# Patient Record
Sex: Female | Born: 1965 | Race: White | Hispanic: No | Marital: Married | Smoking: Never smoker
Health system: Southern US, Community
[De-identification: ages and names within clinical notes are randomized; demographics above are authoritative.]

## PROBLEM LIST (undated history)

## (undated) DIAGNOSIS — F419 Anxiety disorder, unspecified: Secondary | ICD-10-CM

## (undated) DIAGNOSIS — F32A Depression, unspecified: Secondary | ICD-10-CM

## (undated) DIAGNOSIS — D509 Iron deficiency anemia, unspecified: Secondary | ICD-10-CM

## (undated) DIAGNOSIS — F329 Major depressive disorder, single episode, unspecified: Secondary | ICD-10-CM

## (undated) HISTORY — DX: Depression, unspecified: F32.A

## (undated) HISTORY — DX: Iron deficiency anemia, unspecified: D50.9

## (undated) HISTORY — DX: Anxiety disorder, unspecified: F41.9

## (undated) HISTORY — DX: Major depressive disorder, single episode, unspecified: F32.9

---

## 2005-10-25 ENCOUNTER — Ambulatory Visit (HOSPITAL_COMMUNITY): Admission: RE | Admit: 2005-10-25 | Discharge: 2005-10-25 | Payer: Self-pay | Admitting: Family Medicine

## 2007-08-03 ENCOUNTER — Ambulatory Visit (HOSPITAL_COMMUNITY): Admission: RE | Admit: 2007-08-03 | Discharge: 2007-08-03 | Payer: Self-pay | Admitting: Family Medicine

## 2010-07-06 ENCOUNTER — Ambulatory Visit (HOSPITAL_COMMUNITY): Admission: RE | Admit: 2010-07-06 | Discharge: 2010-07-06 | Payer: Self-pay | Admitting: Family Medicine

## 2012-03-17 IMAGING — CR DG LUMBAR SPINE COMPLETE 4+V
5 series · 5 of 5 positions shown · non-contrast
Comparison: Lumbar spine series 10/25/2005.

CLINICAL DATA: Low back and left leg pain.

LUMBAR SPINE - COMPLETE 4+ VIEW

[view not recorded (1 of 5)]
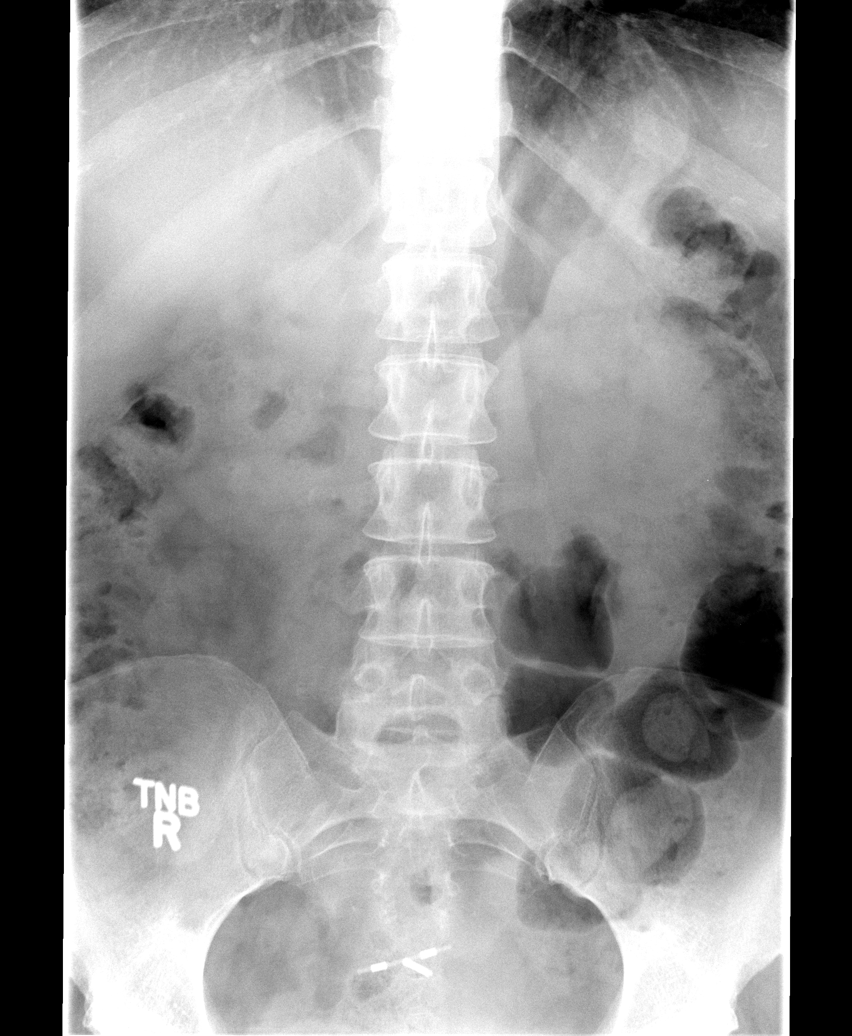

[view not recorded (2 of 5)]
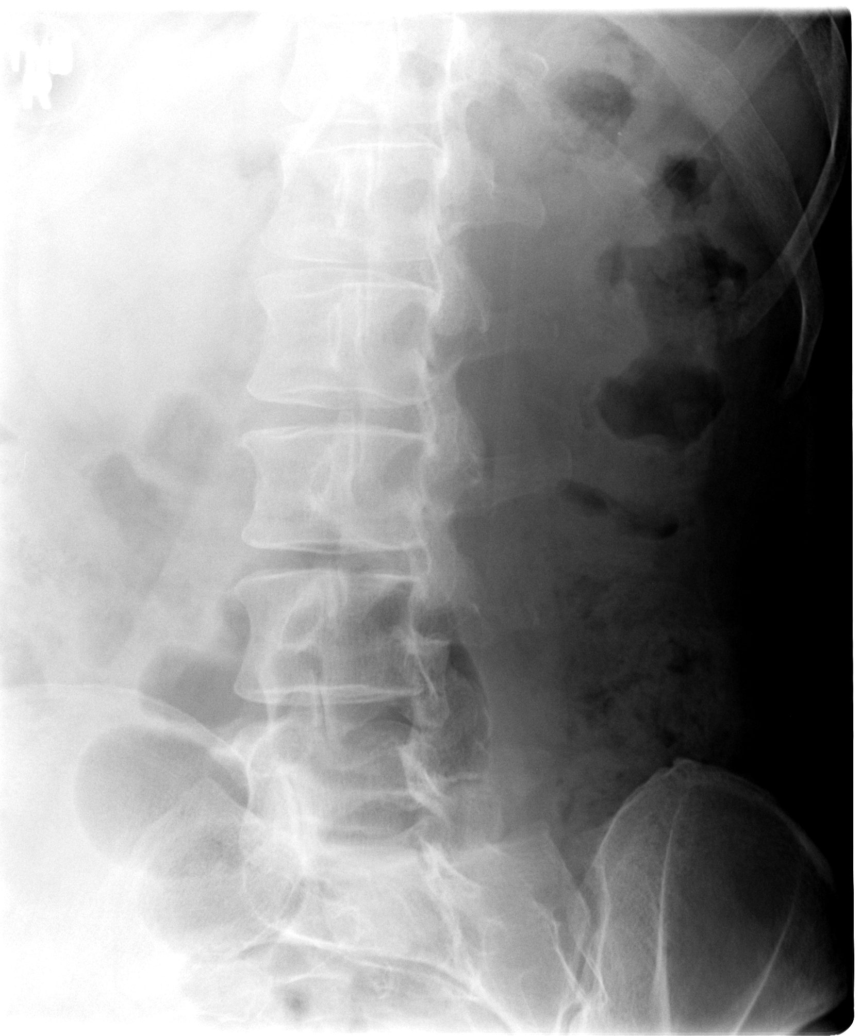

[view not recorded (3 of 5)]
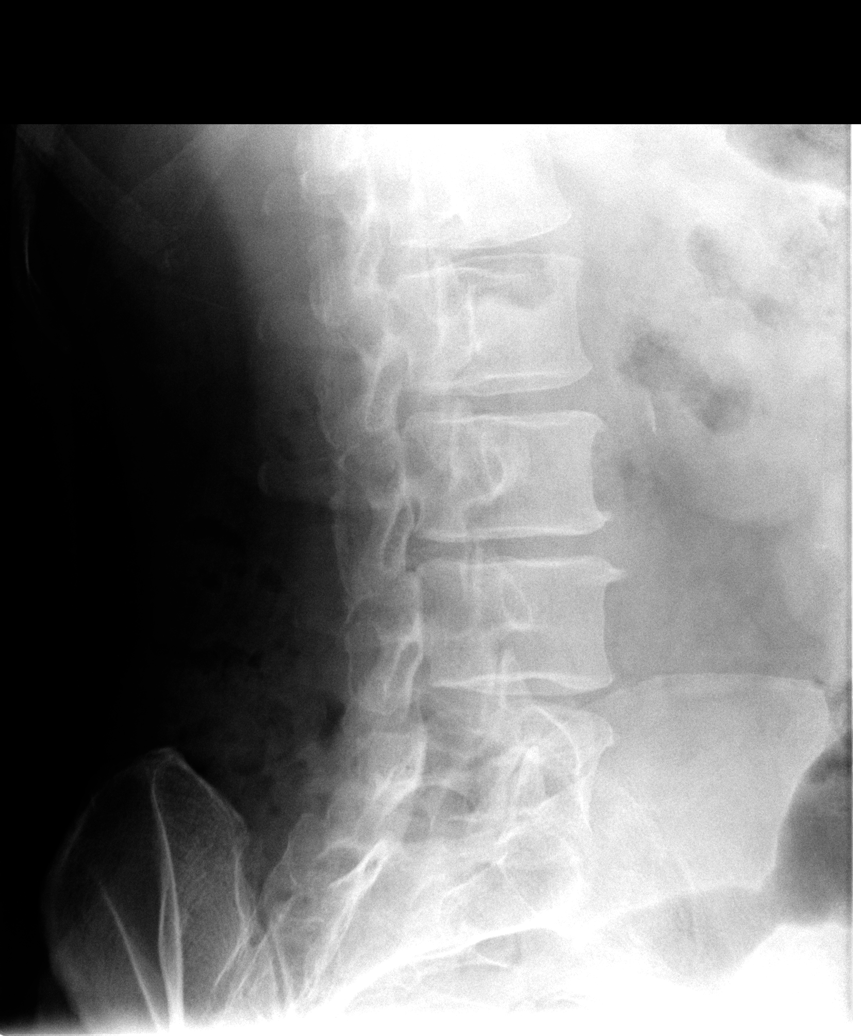

[view not recorded (4 of 5)]
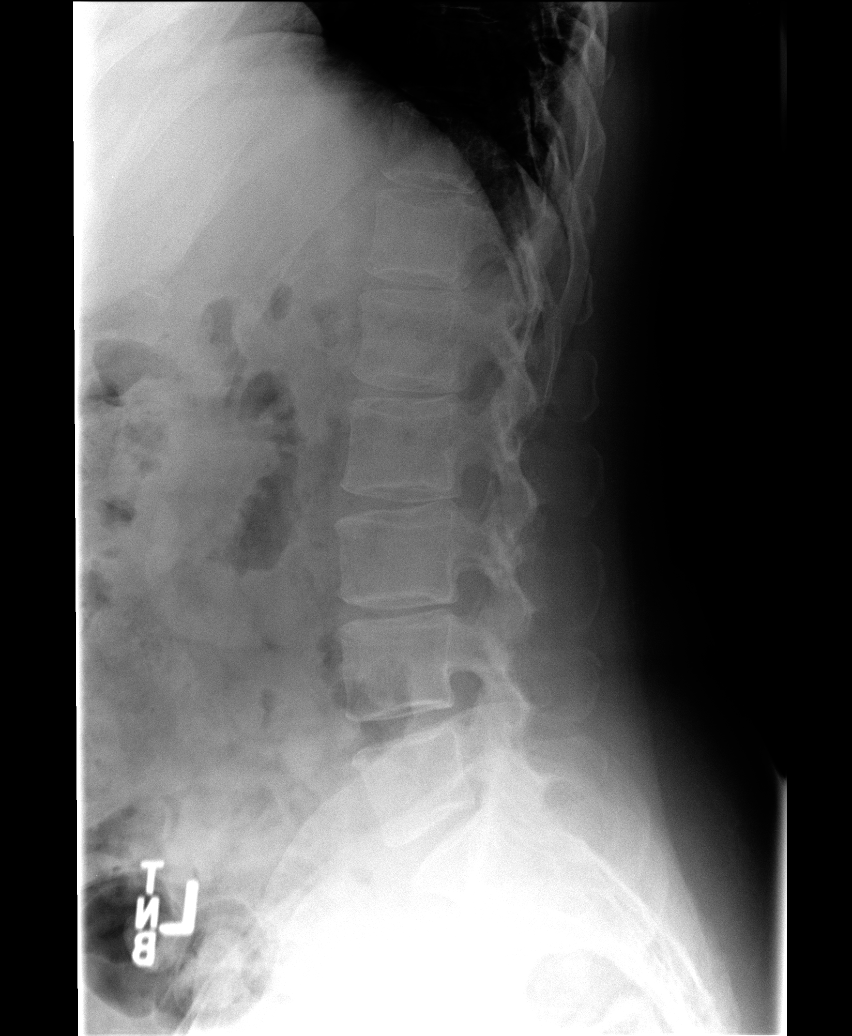

[view not recorded (5 of 5)]
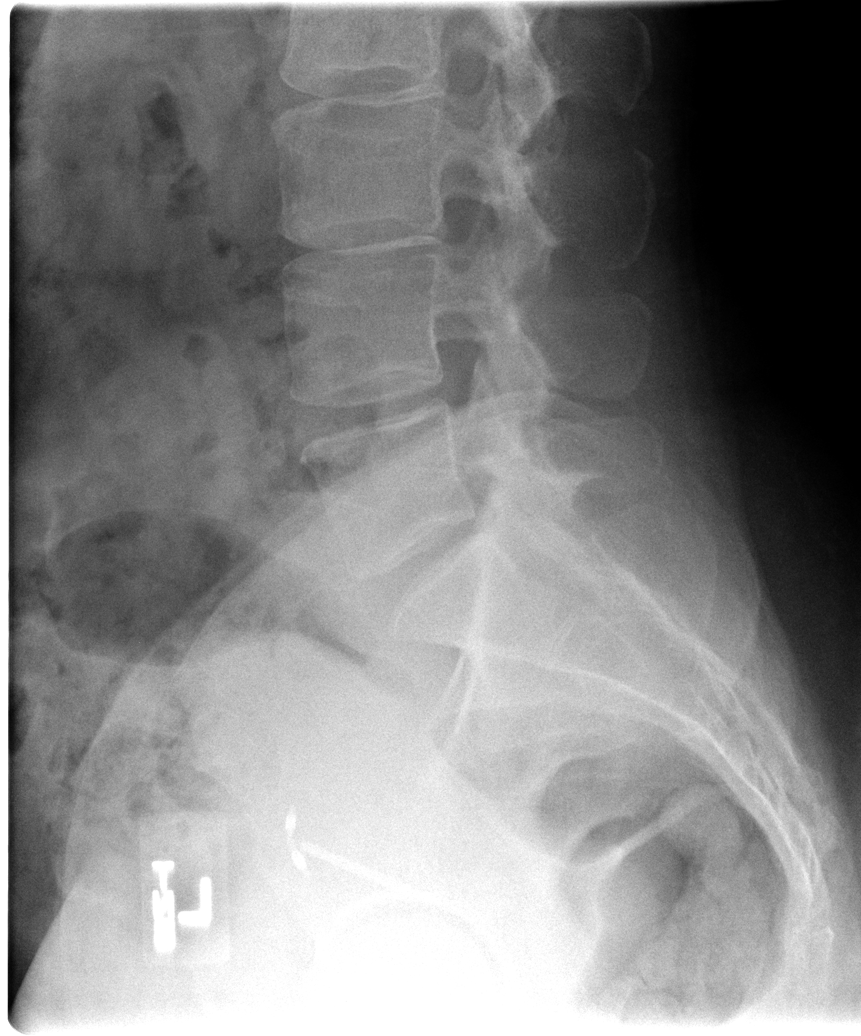

[5 of 5 positions shown; findings below may reference images not displayed]

FINDINGS: The lateral film demonstrates normal alignment.
Vertebral bodies and disc spaces are maintained.  No acute bony
findings.  Normal alignment of the facet joints and no pars
defects.  The visualized bony pelvis in intact.  An IUD is noted in
the pelvis.
IMPRESSION: Normal alignment and no acute bony findings.  No change since prior
study.

## 2012-09-17 ENCOUNTER — Ambulatory Visit (INDEPENDENT_AMBULATORY_CARE_PROVIDER_SITE_OTHER): Payer: PRIVATE HEALTH INSURANCE | Admitting: Psychiatry

## 2012-09-17 ENCOUNTER — Encounter (HOSPITAL_COMMUNITY): Payer: Self-pay | Admitting: Psychiatry

## 2012-09-17 VITALS — Ht 65.5 in | Wt 179.4 lb

## 2012-09-17 DIAGNOSIS — F419 Anxiety disorder, unspecified: Secondary | ICD-10-CM | POA: Insufficient documentation

## 2012-09-17 DIAGNOSIS — F3289 Other specified depressive episodes: Secondary | ICD-10-CM

## 2012-09-17 DIAGNOSIS — E559 Vitamin D deficiency, unspecified: Secondary | ICD-10-CM | POA: Insufficient documentation

## 2012-09-17 DIAGNOSIS — F329 Major depressive disorder, single episode, unspecified: Secondary | ICD-10-CM

## 2012-09-17 DIAGNOSIS — F410 Panic disorder [episodic paroxysmal anxiety] without agoraphobia: Secondary | ICD-10-CM

## 2012-09-17 DIAGNOSIS — F411 Generalized anxiety disorder: Secondary | ICD-10-CM

## 2012-09-17 MED ORDER — VENLAFAXINE HCL ER 75 MG PO CP24: 150.0000 mg | ORAL_CAPSULE | Freq: Every day | ORAL | Status: DC

## 2012-09-17 MED ORDER — PROPRANOLOL HCL 10 MG PO TABS: 10.0000 mg | ORAL_TABLET | Freq: Three times a day (TID) | ORAL | Status: DC

## 2012-09-17 NOTE — Progress Notes (Addendum)
Psychiatric Assessment Adult [facility] [id number] Progress Note Whitney Valentine MRN: [id number] DOB: Feb 12, 1966 Age: 47 y.o.  Date: 09/17/2012 Start Time: 1:30 PM End Time: 2:30 PM  Chief Complaint: Chief Complaint  Patient presents with  . Anxiety  . Follow-up  . Medication Refill    History of Chief Complaint:   See HPI:  Anxiety Presents for initial visit. Onset was more than 5 years ago. The problem has been waxing and waning. Symptoms include chest pain, confusion, decreased concentration, depressed mood, dizziness, excessive worry, feeling of choking, hyperventilation, irritability, malaise, muscle tension, nausea, nervous/anxious behavior, palpitations, panic and shortness of breath. Patient reports no compulsions, dry mouth, insomnia, obsessions, restlessness or suicidal ideas. Symptoms occur constantly. The severity of symptoms is causing significant distress. The symptoms are aggravated by caffeine, family issues, social activities and work stress. The patient sleeps 13 hours per night. The quality of sleep is non-restorative. Nighttime awakenings: occasional.   Risk factors include family history, a major life event and marital problems. Her past medical history is significant for anxiety/panic attacks, arrhythmia and depression. There is no history of anemia, asthma, bipolar disorder, CAD, CHF, chronic lung disease, fibromyalgia, hyperthyroidism or suicide attempts. Past treatments include non-SSRI antidepressants and herbal remedies. The treatment provided mild relief. Compliance with prior treatments has been good.   Review of Systems  Constitutional: Positive for irritability, fatigue and unexpected weight change. Negative for fever, chills, diaphoresis, activity change and appetite change.  HENT: Positive for sneezing. Negative for hearing loss, ear pain, nosebleeds, congestion, sore throat, facial swelling, rhinorrhea, drooling, mouth sores, trouble  swallowing, neck pain, neck stiffness, dental problem, voice change, postnasal drip, sinus pressure, tinnitus and ear discharge.   Eyes: Negative.   Respiratory: Positive for chest tightness and shortness of breath. Negative for apnea, cough, choking, wheezing and stridor.   Cardiovascular: Positive for chest pain and palpitations. Negative for leg swelling.  Gastrointestinal: Positive for nausea. Negative for vomiting, abdominal pain, diarrhea, constipation, blood in stool, abdominal distention, anal bleeding and rectal pain.  Genitourinary: Negative.   Musculoskeletal: Negative.   Skin: Negative.   Neurological: Positive for dizziness and light-headedness. Negative for tremors, seizures, syncope, facial asymmetry, speech difficulty, weakness, numbness and headaches.  Hematological: Negative.   Psychiatric/Behavioral: Positive for confusion, sleep disturbance, dysphoric mood, decreased concentration and agitation. Negative for suicidal ideas, hallucinations, behavioral problems and self-injury. The patient is nervous/anxious. The patient does not have insomnia and is not hyperactive.   Skin: gets acne break outs when nervous.  Physical Exam  Depressive Symptoms: anhedonia, hypersomnia, psychomotor agitation, fatigue, difficulty concentrating, hopelessness, impaired memory, anxiety, panic attacks, weight gain, decreased labido, increased appetite,  (Hypo) Manic Symptoms:   Elevated Mood:  No Irritable Mood:  Yes Grandiosity:  No Distractibility:  Yes Labiality of Mood:  Yes Delusions:  No Hallucinations:  No Impulsivity:  No Sexually Inappropriate Behavior:  No Financial Extravagance:  No Flight of Ideas:  No  Anxiety Symptoms: Excessive Worry:  Yes Panic Symptoms:  Yes Agoraphobia:  No Obsessive Compulsive: No  Symptoms:  Specific Phobias:  No Social Anxiety:  No  Psychotic Symptoms:  Hallucinations: No  Delusions:  No Paranoia:  No   Ideas of Reference:   No  PTSD Symptoms: Ever had a traumatic exposure:  No Had a traumatic exposure in the last month:  No Re-experiencing: No  Hypervigilance:  No Hyperarousal: No  Avoidance: No   Traumatic Brain Injury: No   Past Psychiatric History: Diagnosis: Anxiety and Depression  Hospitalizations:  none  Outpatient Care: PCP  Substance Abuse Care: none  Self-Mutilation: none  Suicidal Attempts: none  Violent Behaviors: none   Past Medical History:   Past Medical History  Diagnosis Date  . Anxiety    History of Loss of Consciousness:  No Seizure History:  No Cardiac History:  No Allergies:   Allergies  Allergen Reactions  . Penicillins Other (See Comments)    Joint swelling    Current Medications:  Current Outpatient Prescriptions  Medication Sig Dispense Refill  . venlafaxine (EFFEXOR) 37.5 MG tablet Take 37.5 mg by mouth 2 (two) times daily.        Previous Psychotropic Medications:  Medication Dose   Venlafaxin  37.5 mg BID                     Substance Abuse History in the last 12 months: Substance Age of 1st Use Last Use Amount Specific Type  Nicotine  15  20      Alcohol  teens  teens      Cannabis  none        Opiates  none        Cocaine  none        Methamphetamines  none        LSD  none        Ecstasy  none         Benzodiazepines  none        Caffeine  childhood  1 hours ago  20 oz  Diet Coke  Inhalants  none        Others:       Sugar  childhood  today  handful  chocolate covered nuts                Medical Consequences of Substance Abuse: weight gain  Legal Consequences of Substance Abuse: none  Family Consequences of Substance Abuse: none  Blackouts:  No DT's:  No Withdrawal Symptoms:  No   Social History: Current Place of Residence: *[address] [place] [address] Place of Birth: [place], [place] Family Members: husband and son Marital Status:  Married Children: 1  Sons: 1  Daughters: 0 Relationships: husband and son Education:   HS Print production planner Problems/Performance: concentration, focus, retention Religious Beliefs/Practices: Advice worker History of Abuse: none Armed forces technical officer; Pensions consultant History:  None. Legal History: none Hobbies/Interests: reading walking and music  Family History:   Family History  Problem Relation Age of Onset  . Anxiety disorder Mother   . Cancer Mother   . Depression Mother   . Alcohol abuse Father   . Anxiety disorder Father   . Cancer Father   . Depression Father   . Alcohol abuse Brother   . Anxiety disorder Brother   . Cancer Brother   . Dementia Maternal Grandmother   . ADD / ADHD Neg Hx   . Bipolar disorder Neg Hx   . Drug abuse Neg Hx   . OCD Neg Hx   . Paranoid behavior Neg Hx   . Schizophrenia Neg Hx   . Seizures Neg Hx   . Sexual abuse Neg Hx   . Physical abuse Neg Hx   . Anxiety disorder Son     Mental Status Examination/Evaluation: Objective:  Appearance: Casual  Eye Contact::  Good  Speech:  Clear and Coherent  Volume:  Normal  Mood:  Depressed  Affect:  Congruent  Thought Process:  Coherent, Intact and Logical  Orientation:  Full (Time, Place, and Person)  Thought Content:  WDL  Suicidal Thoughts:  No  Homicidal Thoughts:  No  Judgement:  Good  Insight:  Good  Psychomotor Activity:  Normal  Akathisia:  No  Handed:  Left  AIMS (if indicated):    Assets:  Communication Skills Desire for Improvement    Laboratory/X-Ray Psychological Evaluation(s)        Assessment:    AXIS I Depressive Disorder NOS, Generalized Anxiety Disorder and Panic Disorder  AXIS II Deferred  AXIS III Past Medical History  Diagnosis Date  . Anxiety      AXIS IV other psychosocial or environmental problems  AXIS V 51-60 moderate symptoms   Treatment Plan/Recommendations:  Laboratory:  Vitamin D  Psychotherapy: CBT  Medications: Effexor and Inderal  Routine PRN Medications:  No  Consultations: none  Safety  Concerns:  none  Other:     Plan/Discussion/Summary: I took her vitals.  I reviewed CC, tobacco/med/surg Hx, meds effects/ side effects, problem list, therapies and responses as well as current situation/symptoms discussed options. See orders and pt instructions for more details.  Orson Aloe, MD, [facility] Addendum: 09/18/2012 Vitamin level low at 26,  Scripts for replacement ordered.  Home number not available.  Called pharmacy and asked them to call and notify that scripts available for her. Orson Aloe, MD, [facility]

## 2012-09-17 NOTE — Patient Instructions (Addendum)
Get Vitamin D level  CUT BACK/CUT OUT on sugar and carbohydrates, that means very limited fruits and starchy vegetables and very limited grains, breads  The goal is low GLYCEMIC INDEX.  CUT OUT all wheat, rye, or barley for the GLUTEN in them.  HIGH fat and LOW carbohydrate diet is the KEY.  Eat avocados, eggs, lean meat like grass fed beef and chicken  Nuts and seeds would be good foods as well.   Stevia is an excellent sweetener.  Safe for the brain.   Erythritol is also acceptable for brain health.   Almond butter is awesome.  Check out all this on the Internet.  Relaxation is the ultimate solution for you.  You can seek it through tub baths, bubble baths, essential oils or incense, walking or chatting with friends, listening to soft music, watching a candle burn and just letting all thoughts go and appreciating the true essence of the Creator.   "I am Wishes Fulfilled Meditation" by Marylene Buerger and Lyndal Pulley may be helpful for meditation  Yoga is a very helpful exercise method.  On TV or on line [employer] is a source of high quality information about yoga and videos on yoga.  Renee Ramus is the world's number one video yoga instructor according to some experts.  There are exceptional health benefits that can be achieved through yoga.  The main principles of yoga is acceptance, no competition, no comparison, and no judgement.  It is exceptional in helping people meditate and get to a very relaxed state.   Strongly consider attending at least 6 Alanon Meetings to help you learn about how your helping others to the exclusion of helping yourself is actually hurting yourself and is actually an addiction to fixing others and that you need to work the 12 Step to Happiness through the [employer]. Al-Anon Family Groups could be helpful with how to deal with substance abusing family and friends. Or your own issues of being in victim role.  There are only 40 Alanon Family Group meetings  a week here in [place].  Online are current listing of those meetings @ [email or web address]  There are [employer].  Search on line and there you can learn the format and can access the schedule for yourself.  Their number is [phone]  The one in [place] is at the [facility] on [address]  The meeting is at 7 PM on Tuesdays.   Call if problems or concerns.

## 2012-09-18 MED ORDER — ERGOCALCIFEROL 1.25 MG (50000 UT) PO CAPS: 50000.0000 [IU] | ORAL_CAPSULE | ORAL | Status: DC

## 2012-09-18 MED ORDER — CALCIUM CARBONATE-VITAMIN D 500-200 MG-UNIT PO TABS: 1.0000 | ORAL_TABLET | Freq: Two times a day (BID) | ORAL | Status: DC

## 2012-09-18 NOTE — Addendum Note (Signed)
Addended by: Mike Craze on: 09/18/2012 01:21 PM   Modules accepted: Orders

## 2012-10-31 ENCOUNTER — Ambulatory Visit (HOSPITAL_COMMUNITY): Payer: Self-pay | Admitting: Psychiatry

## 2012-11-27 ENCOUNTER — Encounter (HOSPITAL_COMMUNITY): Payer: Self-pay | Admitting: Psychiatry

## 2012-11-27 ENCOUNTER — Ambulatory Visit (INDEPENDENT_AMBULATORY_CARE_PROVIDER_SITE_OTHER): Payer: PRIVATE HEALTH INSURANCE | Admitting: Psychiatry

## 2012-11-27 VITALS — Wt 178.4 lb

## 2012-11-27 DIAGNOSIS — R413 Other amnesia: Secondary | ICD-10-CM

## 2012-11-27 DIAGNOSIS — F411 Generalized anxiety disorder: Secondary | ICD-10-CM

## 2012-11-27 DIAGNOSIS — E559 Vitamin D deficiency, unspecified: Secondary | ICD-10-CM

## 2012-11-27 DIAGNOSIS — F410 Panic disorder [episodic paroxysmal anxiety] without agoraphobia: Secondary | ICD-10-CM

## 2012-11-27 DIAGNOSIS — F419 Anxiety disorder, unspecified: Secondary | ICD-10-CM

## 2012-11-27 DIAGNOSIS — F329 Major depressive disorder, single episode, unspecified: Secondary | ICD-10-CM

## 2012-11-27 MED ORDER — PROPRANOLOL HCL 10 MG PO TABS: 10.0000 mg | ORAL_TABLET | Freq: Four times a day (QID) | ORAL | Status: DC

## 2012-11-27 MED ORDER — DONEPEZIL HCL 5 MG PO TABS: 5.0000 mg | ORAL_TABLET | Freq: Every day | ORAL | Status: DC

## 2012-11-27 MED ORDER — VENLAFAXINE HCL ER 75 MG PO CP24: 150.0000 mg | ORAL_CAPSULE | Freq: Every day | ORAL | Status: DC

## 2012-11-27 NOTE — Progress Notes (Signed)
[facility] [id number] Progress Note Whitney Valentine MRN: [id number] DOB: 07-Jul-1966 Age: 47 y.o.  Date: 11/27/2012 Start Time: 3:20 PM End Time: 4:02 PM  Chief Complaint: Chief Complaint  Patient presents with  . Anxiety  . Depression  . Follow-up  . Medication Refill   Subjective: "I'm doing well, stable". Depression 7/10 and Anxiety 5/10, where 0 is none and 10 is the worst.  Pain is 0/10  Pt returns for follow up appointment.  Pt reports that she is compliant with the psychotropic medications with good benefit and no noticeable side effects.  She has noted particular benefit with the Inderal.  Her anxiety is still pretty distracting and her memory is very bad and causes difficulty with her job.  Will push Inderal and consider adding Aricept.  History of Chief Complaint:   See HPI:  Anxiety Presents for initial visit. Onset was more than 5 years ago. The problem has been waxing and waning. Symptoms include chest pain, confusion, decreased concentration, depressed mood, dizziness, excessive worry, feeling of choking, hyperventilation, irritability, malaise, muscle tension, nausea, nervous/anxious behavior, palpitations, panic and shortness of breath. Patient reports no compulsions, dry mouth, insomnia, obsessions, restlessness or suicidal ideas. Symptoms occur constantly. The severity of symptoms is causing significant distress. The symptoms are aggravated by caffeine, family issues, social activities and work stress. The patient sleeps 13 hours per night. The quality of sleep is non-restorative. Nighttime awakenings: occasional.   Risk factors include family history, a major life event and marital problems. Her past medical history is significant for anxiety/panic attacks, arrhythmia and depression. There is no history of anemia, asthma, bipolar disorder, CAD, CHF, chronic lung disease, fibromyalgia, hyperthyroidism or suicide attempts. Past treatments include non-SSRI  antidepressants and herbal remedies. The treatment provided mild relief. Compliance with prior treatments has been good.   Review of Systems  Constitutional: Positive for irritability, fatigue and unexpected weight change. Negative for fever, chills, diaphoresis, activity change and appetite change.  HENT: Positive for sneezing. Negative for hearing loss, ear pain, nosebleeds, congestion, sore throat, facial swelling, rhinorrhea, drooling, mouth sores, trouble swallowing, neck pain, neck stiffness, dental problem, voice change, postnasal drip, sinus pressure, tinnitus and ear discharge.   Eyes: Negative.   Respiratory: Positive for chest tightness and shortness of breath. Negative for apnea, cough, choking, wheezing and stridor.   Cardiovascular: Positive for chest pain and palpitations. Negative for leg swelling.  Gastrointestinal: Positive for nausea. Negative for vomiting, abdominal pain, diarrhea, constipation, blood in stool, abdominal distention, anal bleeding and rectal pain.  Genitourinary: Negative.   Musculoskeletal: Negative.   Skin: Negative.   Neurological: Positive for dizziness and light-headedness. Negative for tremors, seizures, syncope, facial asymmetry, speech difficulty, weakness, numbness and headaches.  Psychiatric/Behavioral: Positive for confusion, sleep disturbance, dysphoric mood, decreased concentration and agitation. Negative for suicidal ideas, hallucinations, behavioral problems and self-injury. The patient is nervous/anxious. The patient does not have insomnia and is not hyperactive.   Skin: gets acne break outs when nervous.  Physical Exam  Depressive Symptoms: anhedonia, hypersomnia, psychomotor agitation, fatigue, difficulty concentrating, hopelessness, impaired memory, anxiety, panic attacks, weight gain, decreased labido, increased appetite,  (Hypo) Manic Symptoms:   Elevated Mood:  No Irritable Mood:  Yes Grandiosity:  No Distractibility:   Yes Labiality of Mood:  Yes Delusions:  No Hallucinations:  No Impulsivity:  No Sexually Inappropriate Behavior:  No Financial Extravagance:  No Flight of Ideas:  No  Anxiety Symptoms: Excessive Worry:  Yes Panic Symptoms:  Yes Agoraphobia:  No Obsessive  Compulsive: No  Symptoms:  Specific Phobias:  No Social Anxiety:  No  Psychotic Symptoms:  Hallucinations: No  Delusions:  No Paranoia:  No   Ideas of Reference:  No  PTSD Symptoms: Ever had a traumatic exposure:  No Had a traumatic exposure in the last month:  No Re-experiencing: No  Hypervigilance:  No Hyperarousal: No  Avoidance: No   Traumatic Brain Injury: No   Past Psychiatric History: Diagnosis: Anxiety and Depression  Hospitalizations: none  Outpatient Care: PCP  Substance Abuse Care: none  Self-Mutilation: none  Suicidal Attempts: none  Violent Behaviors: none   Past Medical History:   Past Medical History  Diagnosis Date  . Anxiety   . Depression    History of Loss of Consciousness:  No Seizure History:  No Cardiac History:  No Allergies:   Allergies  Allergen Reactions  . Penicillins Other (See Comments)    Joint swelling    Current Medications:  Current Outpatient Prescriptions  Medication Sig Dispense Refill  . calcium-vitamin D (OSCAL 500/200 D-3) 500-200 MG-UNIT per tablet Take 1 tablet by mouth 2 (two) times daily.  60 tablet  1  . ergocalciferol (VITAMIN D2) 50000 UNITS capsule Take 1 capsule (50,000 Units total) by mouth once a week. Take until you run out of fills and then get Vitamin level retested.  4 capsule  1  . propranolol (INDERAL) 10 MG tablet Take 1 tablet (10 mg total) by mouth 3 (three) times daily.  90 tablet  1  . venlafaxine XR (EFFEXOR XR) 75 MG 24 hr capsule Take 2 capsules (150 mg total) by mouth daily.  60 capsule  2   No current facility-administered medications for this visit.    Previous Psychotropic Medications:  Medication Dose   Venlafaxin  37.5 mg BID                      Substance Abuse History in the last 12 months: Substance Age of 1st Use Last Use Amount Specific Type  Nicotine  15  20      Alcohol  teens  teens      Cannabis  none        Opiates  none        Cocaine  none        Methamphetamines  none        LSD  none        Ecstasy  none         Benzodiazepines  none        Caffeine  childhood  1 hours ago  20 oz  Diet Coke  Inhalants  none        Others:       Sugar  childhood  today  handful  chocolate covered nuts                Medical Consequences of Substance Abuse: weight gain  Legal Consequences of Substance Abuse: none  Family Consequences of Substance Abuse: none  Blackouts:  No DT's:  No Withdrawal Symptoms:  No   Social History: Current Place of Residence: *[address] [place] [address] Place of Birth: [place], [place] Family Members: husband and son Marital Status:  Married Children: 1  Sons: 1  Daughters: 0 Relationships: husband and son Education:  HS Print production planner Problems/Performance: concentration, focus, retention Religious Beliefs/Practices: Advice worker History of Abuse: none Armed forces technical officer; Pensions consultant History:  None. Legal History: none Hobbies/Interests: reading walking  and music  Family History:   Family History  Problem Relation Age of Onset  . Anxiety disorder Mother   . Cancer Mother   . Depression Mother   . Alcohol abuse Father   . Anxiety disorder Father   . Cancer Father   . Depression Father   . Alcohol abuse Brother   . Anxiety disorder Brother   . Cancer Brother   . Dementia Maternal Grandmother   . ADD / ADHD Neg Hx   . Bipolar disorder Neg Hx   . Drug abuse Neg Hx   . OCD Neg Hx   . Paranoid behavior Neg Hx   . Schizophrenia Neg Hx   . Seizures Neg Hx   . Sexual abuse Neg Hx   . Physical abuse Neg Hx   . Anxiety disorder Son   . Exostosis Son     Mental Status Examination/Evaluation: Objective:   Appearance: Casual  Eye Contact::  Good  Speech:  Clear and Coherent  Volume:  Normal  Mood:  Depressed  Affect:  Congruent  Thought Process:  Coherent, Intact and Logical  Orientation:  Full (Time, Place, and Person)  Thought Content:  WDL  Suicidal Thoughts:  No  Homicidal Thoughts:  No  Judgement:  Good  Insight:  Good  Psychomotor Activity:  Normal  Akathisia:  No  Handed:  Left  AIMS (if indicated):    Assets:  Communication Skills Desire for Improvement    Laboratory/X-Ray Psychological Evaluation(s)        Assessment:    AXIS I Depressive Disorder NOS, Generalized Anxiety Disorder and Panic Disorder  AXIS II Deferred  AXIS III Past Medical History  Diagnosis Date  . Anxiety   . Depression      AXIS IV other psychosocial or environmental problems  AXIS V 51-60 moderate symptoms   Treatment Plan/Recommendations:  Laboratory:  Vitamin D  Psychotherapy: CBT  Medications: Effexor and Inderal  Routine PRN Medications:  No  Consultations: none  Safety Concerns:  none  Other:     Plan/Discussion: I took her vitals.  I reviewed CC, tobacco/med/surg Hx, meds effects/ side effects, problem list, therapies and responses as well as current situation/symptoms discussed options. Increase Inderal, consider adding Aricept for memory problems.  Add Fish oil too. See orders and pt instructions for more details.  MEDICATIONS this encounter: Meds ordered this encounter  Medications  . venlafaxine XR (EFFEXOR XR) 75 MG 24 hr capsule    Sig: Take 2 capsules (150 mg total) by mouth daily.    Dispense:  60 capsule    Refill:  2  . propranolol (INDERAL) 10 MG tablet    Sig: Take 1-2 tablets (10-20 mg total) by mouth 4 (four) times daily.    Dispense:  240 tablet    Refill:  1  . donepezil (ARICEPT) 5 MG tablet    Sig: Take 1 tablet (5 mg total) by mouth at bedtime.    Dispense:  30 tablet    Refill:  2   Medical Decision Making Problem Points:  Established problem,  stable/improving (1), New problem, with no additional work-up planned (3), Review of last therapy session (1) and Review of psycho-social stressors (1) Data Points:  Review or order clinical lab tests (1) Review of medication regiment & side effects (2) Review of new medications or change in dosage (2)  I certify that outpatient services furnished can reasonably be expected to improve the patient's condition.   Orson Aloe, MD, [facility]

## 2012-11-27 NOTE — Patient Instructions (Signed)
CUT BACK/CUT OUT on sugar and carbohydrates, that means very limited fruits and starchy vegetables and very limited grains, breads  The goal is low GLYCEMIC INDEX.  CUT OUT all wheat, rye, or barley for the GLUTEN in them.  HIGH fat and LOW carbohydrate diet is the KEY.  Eat avocados, eggs, lean meat like grass fed beef and chicken  Nuts and seeds would be good foods as well.   Stevia is an excellent sweetener.  Safe for the brain.   Lowella Grip is also a good safe sweetener, not the baking blend form of Truvia  Almond butter is awesome.  Check out all this on the Internet.  Dr [place] is on the Internet with some good info about this.   http://www.drperlmutter.com is where that is.  An excellent site for info on this diet is http://paleoleap.com  [employer] makes dark chocolate that is sweetened with Stevia that is safe.  For your brain health, it advised that you get  regular exercise, regular sleep, and  consume good quality, fish oil, 1000 mg twice a day. These 3 things are the foundation of rehabilitating your brain. Staying off all abusable substances including nicotine, caffeine, and refined sugar and avoiding further head injuries are the other important elements in helping you keep your brain working the best it can for you. If memory is a problem then INSTEAD of the fish oil mentioned above, try using Brain Power Basics from [employer].  You can order online or by phone [phone]. It costs $99 for the first month, and $80 monthly thereafter, but that investment in your brain and the recovery of your brain proper functioning would seem worth it.  [employer] is a great source for fish oil.  [phone]  Try 2 or back down to 1 and 1/2 tabs of the Inderal for the anxiety and memory problems.  Aricept is helpful, but it seems to me like STRESS is the big thing for you.  Yoga is a very helpful exercise method.  On TV, on line, or by DVD Adelfa Koh is a  source of high quality information about yoga and videos on yoga.  Renee Ramus is the world's number one video yoga instructor according to some experts.  There are exceptional health benefits that can be achieved through yoga.  The main principles of yoga is acceptance, no competition, no comparison, and no judgement.  It is exceptional in helping people meditate and get to a very relaxed state.   Kundilini yoga is a method of relaxation that has helped some people.  Set a timer for 8 minutes and walk for that amount of time in the house or in the yard.  Mark "8" on a calendar for that day.  Do that every day this week.  Then next week increase the time to 9 minutes and then mark the calendar with a 9 for that day.  Each week increase your exercise by one minute.  Keep a record of this so you can see what progress you are making.  Do this every day, just like eating and sleeping.  It is good for pain control, depression, and for your soul/spirit.  Bring the record in for your next visit so we can talk about your effort and how you feel with the new exercise program going and working for you.  Relaxation is the ultimate solution for you.  You can seek it through tub baths, bubble baths, essential oils or incense, walking or chatting with friends,  listening to soft music, watching a candle burn and just letting all thoughts go and appreciating the true essence of the Creator.  Pets or animals may be very helpful.  You might spend some time with them and then go do more directed meditation.  "I am Wishes Fulfilled Meditation" by Marylene Buerger and Lyndal Pulley may be helpful MUSIC for getting to sleep or for meditating You can order it from on line.  You might find the chill channel on Pand[employer] and explore the artists that you like better.   Take care of yourself.  No one else is standing up to do the job and only you know what you need.   GET SERIOUS about taking care of yourself.  Do the next right thing  and that often means doing something to care for yourself along the lines of are you hungry, are you angry, are you lonely, are you tired, are you scared?  HALTS is what that stands for.  Call if problems or concerns.

## 2012-12-19 ENCOUNTER — Encounter (HOSPITAL_COMMUNITY): Payer: Self-pay

## 2012-12-19 ENCOUNTER — Emergency Department (HOSPITAL_COMMUNITY)
Admission: EM | Admit: 2012-12-19 | Discharge: 2012-12-19 | Discharge status: Against Medical Advice | Payer: PRIVATE HEALTH INSURANCE | Attending: Emergency Medicine | Admitting: Emergency Medicine

## 2012-12-19 DIAGNOSIS — R109 Unspecified abdominal pain: Secondary | ICD-10-CM | POA: Insufficient documentation

## 2012-12-19 DIAGNOSIS — Z87442 Personal history of urinary calculi: Secondary | ICD-10-CM | POA: Insufficient documentation

## 2012-12-19 NOTE — ED Notes (Signed)
Pt c/o burning sensation in lower abd and pain in r flank area x  2 days.  Reports history of kidney stones.  Denies any n/v.  Pt says feels like has to void but only dribbles.

## 2012-12-19 NOTE — ED Notes (Signed)
Was told by registration staff that patient left.

## 2012-12-25 ENCOUNTER — Ambulatory Visit (HOSPITAL_COMMUNITY): Payer: Self-pay | Admitting: Psychiatry

## 2013-01-15 ENCOUNTER — Encounter (HOSPITAL_COMMUNITY): Payer: Self-pay | Admitting: Psychiatry

## 2013-01-15 ENCOUNTER — Ambulatory Visit (INDEPENDENT_AMBULATORY_CARE_PROVIDER_SITE_OTHER): Payer: PRIVATE HEALTH INSURANCE | Admitting: Psychiatry

## 2013-01-15 VITALS — BP 134/86 | Ht 64.5 in | Wt 181.4 lb

## 2013-01-15 DIAGNOSIS — F410 Panic disorder [episodic paroxysmal anxiety] without agoraphobia: Secondary | ICD-10-CM

## 2013-01-15 DIAGNOSIS — F411 Generalized anxiety disorder: Secondary | ICD-10-CM

## 2013-01-15 DIAGNOSIS — R413 Other amnesia: Secondary | ICD-10-CM

## 2013-01-15 DIAGNOSIS — E559 Vitamin D deficiency, unspecified: Secondary | ICD-10-CM

## 2013-01-15 DIAGNOSIS — F329 Major depressive disorder, single episode, unspecified: Secondary | ICD-10-CM

## 2013-01-15 MED ORDER — PROPRANOLOL HCL 10 MG PO TABS: 10.0000 mg | ORAL_TABLET | Freq: Four times a day (QID) | ORAL | Status: DC

## 2013-01-15 MED ORDER — PROPRANOLOL HCL ER 60 MG PO CP24: 60.0000 mg | ORAL_CAPSULE | Freq: Every day | ORAL | Status: DC

## 2013-01-15 MED ORDER — VENLAFAXINE HCL ER 75 MG PO CP24: 150.0000 mg | ORAL_CAPSULE | Freq: Every day | ORAL | Status: DC

## 2013-01-15 NOTE — Progress Notes (Signed)
Whitney Valentine [id number] Progress Note Whitney Valentine MRN: [id number] DOB: 1966-02-22 Age: 47 y.o.  Date: 01/15/2013 Start Time: 2:50 PM End Time: 3:21 PM  Chief Complaint: Chief Complaint  Patient presents with  . Anxiety  . Depression  . Follow-up  . Medication Refill   Subjective: "I've got iron deficient anemia". Depression 5/10 and Anxiety 6/10, where 0 is none and 10 is the worst.  Pain is 0/10  Pt returns for follow up appointment.  Pt reports that she is compliant with the psychotropic medications with good benefit and no noticeable side effects.  She was frightened by the fact that the Aricept was for alzheimer's and did not start taking that. It was re explained to her that it helps memory in folks of all ages by an effect on the neurotransmitter Acetocholine.  That seemed to satisfy her and she will try that.  Discussed the option of going with Inderal LA and she raised the question of what the cost would be.  Will write paper script for the LA and let her explore that option at her pharmacy.  History of Chief Complaint:   See HPI:  Anxiety Presents for initial visit. Onset was more than 5 years ago. The problem has been waxing and waning. Symptoms include chest pain, confusion, decreased concentration, depressed mood, dizziness, excessive worry, feeling of choking, hyperventilation, irritability, malaise, muscle tension, nausea, nervous/anxious behavior, palpitations, panic and shortness of breath. Patient reports no compulsions, dry mouth, insomnia, obsessions, restlessness or suicidal ideas. Symptoms occur constantly. The severity of symptoms is causing significant distress. The symptoms are aggravated by caffeine, family issues, social activities and work stress. The patient sleeps 13 hours per night. The quality of sleep is non-restorative. Nighttime awakenings: occasional.   Risk factors include family history, a major life event and marital problems. Her past  medical history is significant for anxiety/panic attacks, arrhythmia and depression. There is no history of anemia, asthma, bipolar disorder, CAD, CHF, chronic lung disease, fibromyalgia, hyperthyroidism or suicide attempts. Past treatments include non-SSRI antidepressants and herbal remedies. The treatment provided mild relief. Compliance with prior treatments has been good.   Review of Systems  Constitutional: Positive for irritability, fatigue and unexpected weight change. Negative for fever, chills, diaphoresis, activity change and appetite change.  HENT: Positive for sneezing. Negative for hearing loss, ear pain, nosebleeds, congestion, sore throat, facial swelling, rhinorrhea, drooling, mouth sores, trouble swallowing, neck pain, neck stiffness, dental problem, voice change, postnasal drip, sinus pressure, tinnitus and ear discharge.   Eyes: Negative.   Respiratory: Positive for chest tightness and shortness of breath. Negative for apnea, cough, choking, wheezing and stridor.   Cardiovascular: Positive for chest pain and palpitations. Negative for leg swelling.  Gastrointestinal: Positive for nausea. Negative for vomiting, abdominal pain, diarrhea, constipation, blood in stool, abdominal distention, anal bleeding and rectal pain.  Genitourinary: Negative.   Musculoskeletal: Negative.   Skin: Negative.   Neurological: Positive for dizziness and light-headedness. Negative for tremors, seizures, syncope, facial asymmetry, speech difficulty, weakness, numbness and headaches.  Psychiatric/Behavioral: Positive for confusion, sleep disturbance, dysphoric mood, decreased concentration and agitation. Negative for suicidal ideas, hallucinations, behavioral problems and self-injury. The patient is nervous/anxious. The patient does not have insomnia and is not hyperactive.   Skin: gets acne break outs when nervous.  Physical Exam  Depressive Symptoms: anhedonia, hypersomnia, psychomotor  agitation, fatigue, difficulty concentrating, hopelessness, impaired memory, anxiety, panic attacks, weight gain, decreased labido, increased appetite,  (Hypo) Manic Symptoms:   Elevated Mood:  No Irritable Mood:  Yes Grandiosity:  No Distractibility:  Yes Labiality of Mood:  Yes Delusions:  No Hallucinations:  No Impulsivity:  No Sexually Inappropriate Behavior:  No Financial Extravagance:  No Flight of Ideas:  No  Anxiety Symptoms: Excessive Worry:  Yes Panic Symptoms:  Yes Agoraphobia:  No Obsessive Compulsive: No  Symptoms:  Specific Phobias:  No Social Anxiety:  No  Psychotic Symptoms:  Hallucinations: No  Delusions:  No Paranoia:  No   Ideas of Reference:  No  PTSD Symptoms: Ever had a traumatic exposure:  No Had a traumatic exposure in the last month:  No Re-experiencing: No  Hypervigilance:  No Hyperarousal: No  Avoidance: No   Traumatic Brain Injury: No   Past Psychiatric History: Diagnosis: Anxiety and Depression  Hospitalizations: none  Outpatient Care: PCP  Substance Abuse Care: none  Self-Mutilation: none  Suicidal Attempts: none  Violent Behaviors: none   Past Medical History:   Past Medical History  Diagnosis Date  . Anxiety   . Depression   . Renal disorder     kidney stones  . Iron deficiency anemia    History of Loss of Consciousness:  No Seizure History:  No Cardiac History:  No Allergies:   Allergies  Allergen Reactions  . Penicillins Other (See Comments)    Joint swelling    Current Medications:  Current Outpatient Prescriptions  Medication Sig Dispense Refill  . calcium-vitamin D (OSCAL 500/200 D-3) 500-200 MG-UNIT per tablet Take 1 tablet by mouth 2 (two) times daily.  60 tablet  1  . ergocalciferol (VITAMIN D2) 50000 UNITS capsule Take 1 capsule (50,000 Units total) by mouth once a week. Take until you run out of fills and then get Vitamin level retested.  4 capsule  1  . propranolol (INDERAL) 10 MG tablet Take  1-2 tablets (10-20 mg total) by mouth 4 (four) times daily.  240 tablet  1  . venlafaxine XR (EFFEXOR XR) 75 MG 24 hr capsule Take 2 capsules (150 mg total) by mouth daily.  60 capsule  2  . donepezil (ARICEPT) 5 MG tablet Take 1 tablet (5 mg total) by mouth at bedtime.  30 tablet  2   No current facility-administered medications for this visit.    Previous Psychotropic Medications:  Medication Dose   Venlafaxin  37.5 mg BID   Substance Abuse History in the last 12 months: Substance Age of 1st Use Last Use Amount Specific Type  Nicotine  15  20      Alcohol  teens  teens      Cannabis  none        Opiates  none        Cocaine  none        Methamphetamines  none        LSD  none        Ecstasy  none         Benzodiazepines  none        Caffeine  childhood  1 hours ago  20 oz  Diet Coke  Inhalants  none        Others:       Sugar  childhood  today  handful  chocolate covered nuts  Medical Consequences of Substance Abuse: weight gain Legal Consequences of Substance Abuse: none Family Consequences of Substance Abuse: none Blackouts:  No DT's:  No Withdrawal Symptoms:  No   Social History: Current Place of Residence: *[address] [place] [address] Place  of Birth: [place], [place] Family Members: husband and son Marital Status:  Married Children: 1  Sons: 1  Daughters: 0 Relationships: husband and son Education:  Management consultant Problems/Performance: concentration, focus, retention Religious Beliefs/Practices: Advice worker History of Abuse: none Armed forces technical officer; Pensions consultant History:  None. Legal History: none Hobbies/Interests: reading walking and music  Family History:   Family History  Problem Relation Age of Onset  . Anxiety disorder Mother   . Cancer Mother   . Depression Mother   . Alcohol abuse Father   . Anxiety disorder Father   . Cancer Father   . Depression Father   . Alcohol abuse Brother   . Anxiety  disorder Brother   . Cancer Brother   . Dementia Maternal Grandmother   . ADD / ADHD Neg Hx   . Bipolar disorder Neg Hx   . Drug abuse Neg Hx   . OCD Neg Hx   . Paranoid behavior Neg Hx   . Schizophrenia Neg Hx   . Seizures Neg Hx   . Sexual abuse Neg Hx   . Physical abuse Neg Hx   . Anxiety disorder Son   . Exostosis Son     Mental Status Examination/Evaluation: Objective:  Appearance: Casual  Eye Contact::  Good  Speech:  Clear and Coherent  Volume:  Normal  Mood:  Depressed  Affect:  Congruent  Thought Process:  Coherent, Intact and Logical  Orientation:  Full (Time, Place, and Person)  Thought Content:  WDL  Suicidal Thoughts:  No  Homicidal Thoughts:  No  Judgement:  Good  Insight:  Good  Psychomotor Activity:  Normal  Akathisia:  No  Handed:  Left  AIMS (if indicated):    Assets:  Communication Skills Desire for Improvement   Assessment:    AXIS I Depressive Disorder NOS, Generalized Anxiety Disorder and Panic Disorder  AXIS II Deferred  AXIS III Past Medical History  Diagnosis Date  . Anxiety   . Depression   . Renal disorder     kidney stones  . Iron deficiency anemia      AXIS IV other psychosocial or environmental problems  AXIS V 51-60 moderate symptoms   Treatment Plan/Recommendations:  Laboratory:  Vitamin D follow up at the next visit  Psychotherapy: CBT  Medications: Effexor and Inderal as well as Aricept  Routine PRN Medications:  No  Consultations: none  Safety Concerns:  none  Other:     Plan/Discussion: I took her vitals.  I reviewed CC, tobacco/med/surg Hx, meds effects/ side effects, problem list, therapies and responses as well as current situation/symptoms discussed options. Offer Inderal in LA form, consider adding Aricept for memory problems.  Add Fish oil too. See orders and pt instructions for more details.  MEDICATIONS this encounter: No orders of the defined types were placed in this encounter.   Medical Decision  Making Problem Points:  Established problem, stable/improving (1), Established problem, worsening (2), Review of last therapy session (1) and Review of psycho-social stressors (1) Data Points:  Review or order clinical lab tests (1) Review of medication regiment & side effects (2) Review of new medications or change in dosage (2)  I certify that outpatient services furnished can reasonably be expected to improve the patient's condition.   Orson Aloe, MD, [facility]

## 2013-01-15 NOTE — Patient Instructions (Signed)
Get thyroid labs today  Get Vitamin D labs after completion of replacement medications  Add fish oil to your daily routine.  It really helps the brain and several organs.  Set a timer for 8 or a certain number minutes and walk for that amount of time in the house or in the yard.  Mark the number of minutes on a calendar for that day.  Do that every day this week.  Then next week increase the time by 1 minutes and then mark the calendar with the number of minutes for that day.  Each week increase your exercise by one minute.  Keep a record of this so you can see the progress you are making.  Do this every day, just like eating and sleeping.  It is good for pain control, depression, and for your soul/spirit.  Bring the record in for your next visit so we can talk about your effort and how you feel with the new exercise program going and working for you.  Relaxation is the ultimate solution for you.  You can seek it through tub baths, bubble baths, essential oils or incense, walking or chatting with friends, listening to soft music, watching a candle burn and just letting all thoughts go and appreciating the true essence of the Creator.  Pets or animals may be very helpful.  You might spend some time with them and then go do more directed meditation.  "I am Wishes Fulfilled Meditation" by Marylene Buerger and Lyndal Pulley may be helpful MUSIC for getting to sleep or for meditating You can order it from on line.  You might find the Chill channel on Pandora and explore the artists that you like better.   Take care of yourself.  No one else is standing up to do the job and only you know what you need.   GET SERIOUS about taking care of yourself.  Do the next right thing and that often means doing something to care for yourself along the lines of are you hungry, are you angry, are you lonely, are you tired, are you scared?  HALTS is what that stands for.  Call if problems or concerns.

## 2013-01-16 LAB — T3, FREE: T3, Free: 2.9 pg/mL (ref 2.3–4.2)

## 2013-01-16 LAB — TSH: TSH: 0.487 u[IU]/mL (ref 0.350–4.500)

## 2013-03-18 ENCOUNTER — Ambulatory Visit (HOSPITAL_COMMUNITY): Payer: Self-pay | Admitting: Psychiatry

## 2013-04-29 ENCOUNTER — Other Ambulatory Visit (HOSPITAL_COMMUNITY): Payer: Self-pay | Admitting: Psychiatry

## 2013-04-29 DIAGNOSIS — F341 Dysthymic disorder: Secondary | ICD-10-CM

## 2013-04-29 DIAGNOSIS — F329 Major depressive disorder, single episode, unspecified: Secondary | ICD-10-CM

## 2013-05-03 ENCOUNTER — Ambulatory Visit (HOSPITAL_COMMUNITY): Payer: Self-pay | Admitting: Psychiatry

## 2013-07-01 ENCOUNTER — Ambulatory Visit (INDEPENDENT_AMBULATORY_CARE_PROVIDER_SITE_OTHER): Payer: PRIVATE HEALTH INSURANCE | Admitting: Psychiatry

## 2013-07-01 ENCOUNTER — Encounter (HOSPITAL_COMMUNITY): Payer: Self-pay | Admitting: Psychiatry

## 2013-07-01 VITALS — BP 180/100 | Ht 65.0 in | Wt 179.0 lb

## 2013-07-01 DIAGNOSIS — F411 Generalized anxiety disorder: Secondary | ICD-10-CM

## 2013-07-01 DIAGNOSIS — F329 Major depressive disorder, single episode, unspecified: Secondary | ICD-10-CM

## 2013-07-01 DIAGNOSIS — F410 Panic disorder [episodic paroxysmal anxiety] without agoraphobia: Secondary | ICD-10-CM

## 2013-07-01 DIAGNOSIS — F3289 Other specified depressive episodes: Secondary | ICD-10-CM

## 2013-07-01 MED ORDER — ESCITALOPRAM OXALATE 10 MG PO TABS: 10.0000 mg | ORAL_TABLET | Freq: Every day | ORAL | Status: DC

## 2013-07-01 MED ORDER — PROPRANOLOL HCL 10 MG PO TABS: 10.0000 mg | ORAL_TABLET | Freq: Four times a day (QID) | ORAL | Status: DC

## 2013-07-01 NOTE — Progress Notes (Signed)
Patient ID: [place], female   DOB: 1966/02/09, 47 y.o.   MRN: [id number] [facility] [id number] Progress Note [place] MRN: [id number] DOB: 31-Jan-1966 Age: 47 y.o.  Date: 07/01/2013 Start Time: 2:50 PM End Time: 3:21 PM  Chief Complaint: Chief Complaint  Patient presents with  . Anxiety  . Depression  . Memory Loss  . Follow-up   Subjective: This patient is a 47 year old married white female who lives with her husband and 79 year old son in [place]. 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  The patient states that she has has significant anxiety and depression for the last 3 years. 3 years ago her mother and her brother both died of cancer and she had been her primary caregiver. Her father had died several years prior and now she is the only one left. She's finds that she cries easily and gets anxious and flustered. She still has panic attacks. She also is probably starting to go through menopause she's not had a period in several months. She will be a beginning with her gynecologist until December. She's having frequent hot flashes.  Currently the patient is only taking Inderal which does help her anxiety. She was on Effexor XR which did not help the depression or crying spells but she's been off it for about 2 months. Her sleep is pretty good, she tends to overeat. She also stress at home because her husband has gone to renal failure and has had a kidney transplant and numerous other medical issues  History of Chief Complaint:   See HPI:  Anxiety Presents for initial visit. Onset was more than 5 years ago. The problem has been waxing and waning. Symptoms include chest pain, confusion, decreased concentration, depressed mood, dizziness, excessive worry, feeling of choking, hyperventilation, irritability, malaise, muscle tension, nausea, nervous/anxious behavior, palpitations, panic and shortness of breath. Patient reports no compulsions, dry mouth,  insomnia, obsessions, restlessness or suicidal ideas. Symptoms occur constantly. The severity of symptoms is causing significant distress. The symptoms are aggravated by caffeine, family issues, social activities and work stress. The patient sleeps 13 hours per night. The quality of sleep is non-restorative. Nighttime awakenings: occasional.   Risk factors include family history, a major life event and marital problems. Her past medical history is significant for anxiety/panic attacks, arrhythmia and depression. There is no history of anemia, asthma, bipolar disorder, CAD, CHF, chronic lung disease, fibromyalgia, hyperthyroidism or suicide attempts. Past treatments include non-SSRI antidepressants and herbal remedies. The treatment provided mild relief. Compliance with prior treatments has been good.   Review of Systems  Constitutional: Positive for irritability, fatigue and unexpected weight change. Negative for fever, chills, diaphoresis, activity change and appetite change.  HENT: Positive for sneezing. Negative for congestion, dental problem, drooling, ear discharge, ear pain, facial swelling, hearing loss, mouth sores, nosebleeds, postnasal drip, rhinorrhea, sinus pressure, sore throat, tinnitus, trouble swallowing and voice change.   Eyes: Negative.   Respiratory: Positive for chest tightness and shortness of breath. Negative for apnea, cough, choking, wheezing and stridor.   Cardiovascular: Positive for chest pain and palpitations. Negative for leg swelling.  Gastrointestinal: Positive for nausea. Negative for vomiting, abdominal pain, diarrhea, constipation, blood in stool, abdominal distention, anal bleeding and rectal pain.  Genitourinary: Negative.   Musculoskeletal: Negative.  Negative for neck pain and neck stiffness.  Skin: Negative.   Neurological: Positive for dizziness and light-headedness. Negative for tremors, seizures, syncope, facial asymmetry, speech difficulty, weakness, numbness  and headaches.  Psychiatric/Behavioral: Positive  for confusion, sleep disturbance, dysphoric mood, decreased concentration and agitation. Negative for suicidal ideas, hallucinations, behavioral problems and self-injury. The patient is nervous/anxious. The patient does not have insomnia and is not hyperactive.   Skin: gets acne break outs when nervous.  Physical Exam  Depressive Symptoms: anhedonia, hypersomnia, psychomotor agitation, fatigue, difficulty concentrating, hopelessness, impaired memory, anxiety, panic attacks, weight gain, decreased labido, increased appetite,  (Hypo) Manic Symptoms:   Elevated Mood:  No Irritable Mood:  Yes Grandiosity:  No Distractibility:  Yes Labiality of Mood:  Yes Delusions:  No Hallucinations:  No Impulsivity:  No Sexually Inappropriate Behavior:  No Financial Extravagance:  No Flight of Ideas:  No  Anxiety Symptoms: Excessive Worry:  Yes Panic Symptoms:  Yes Agoraphobia:  No Obsessive Compulsive: No  Symptoms:  Specific Phobias:  No Social Anxiety:  No  Psychotic Symptoms:  Hallucinations: No  Delusions:  No Paranoia:  No   Ideas of Reference:  No  PTSD Symptoms: Ever had a traumatic exposure:  No Had a traumatic exposure in the last month:  No Re-experiencing: No  Hypervigilance:  No Hyperarousal: No  Avoidance: No   Traumatic Brain Injury: No   Past Psychiatric History: Diagnosis: Anxiety and Depression  Hospitalizations: none  Outpatient Care: PCP  Substance Abuse Care: none  Self-Mutilation: none  Suicidal Attempts: none  Violent Behaviors: none   Past Medical History:   Past Medical History  Diagnosis Date  . Anxiety   . Depression   . Iron deficiency anemia    History of Loss of Consciousness:  No Seizure History:  No Cardiac History:  No Allergies:   Allergies  Allergen Reactions  . Penicillins Other (See Comments)    Joint swelling    Current Medications:  Current Outpatient Prescriptions   Medication Sig Dispense Refill  . escitalopram (LEXAPRO) 10 MG tablet Take 1 tablet (10 mg total) by mouth daily.  30 tablet  2  . propranolol (INDERAL) 10 MG tablet Take 1 tablet (10 mg total) by mouth 4 (four) times daily.  120 tablet  2   No current facility-administered medications for this visit.    Previous Psychotropic Medications:  Medication Dose   Venlafaxin  37.5 mg BID   Substance Abuse History in the last 12 months: Substance Age of 1st Use Last Use Amount Specific Type  Nicotine  15  20      Alcohol  teens  teens      Cannabis  none        Opiates  none        Cocaine  none        Methamphetamines  none        LSD  none        Ecstasy  none         Benzodiazepines  none        Caffeine  childhood  1 hours ago  20 oz  Diet Coke  Inhalants  none        Others:       Sugar  childhood  today  handful  chocolate covered nuts  Medical Consequences of Substance Abuse: weight gain Legal Consequences of Substance Abuse: none Family Consequences of Substance Abuse: none Blackouts:  No DT's:  No Withdrawal Symptoms:  No   Social History: Current Place of Residence: *[address] [place] [address] Place of Birth: [place], [place] Family Members: husband and son Marital Status:  Married Children: 1  Sons: 1  Daughters: 0 Relationships:  husband and son Education:  HS Graduate Educational Problems/Performance: concentration, focus, retention Religious Beliefs/Practices: [employer] History of Abuse: none Occupational Experiences; Pensions consultant History:  None. Legal History: none Hobbies/Interests: reading walking and music  Family History:   Family History  Problem Relation Age of Onset  . Anxiety disorder Mother   . Cancer Mother   . Depression Mother   . Alcohol abuse Father   . Anxiety disorder Father   . Cancer Father   . Depression Father   . Alcohol abuse Brother   . Anxiety disorder Brother   . Cancer Brother   .  Dementia Maternal Grandmother   . ADD / ADHD Neg Hx   . Bipolar disorder Neg Hx   . Drug abuse Neg Hx   . OCD Neg Hx   . Paranoid behavior Neg Hx   . Schizophrenia Neg Hx   . Seizures Neg Hx   . Sexual abuse Neg Hx   . Physical abuse Neg Hx   . Anxiety disorder Son   . Exostosis Son     Mental Status Examination/Evaluation: Objective:  Appearance: Casual  Eye Contact::  Good  Speech:  Clear and Coherent  Volume:  Normal  Mood:  Depressed  Affect:  Congruent  Thought Process:  Coherent, Intact and Logical  Orientation:  Full (Time, Place, and Person)  Thought Content:  WDL  Suicidal Thoughts:  No  Homicidal Thoughts:  No  Judgement:  Good  Insight:  Good  Psychomotor Activity:  Normal  Akathisia:  No  Handed:  Left  AIMS (if indicated):    Assets:  Communication Skills Desire for Improvement   Assessment:    AXIS I Depressive Disorder NOS, Generalized Anxiety Disorder and Panic Disorder  AXIS II Deferred  AXIS III Past Medical History  Diagnosis Date  . Anxiety   . Depression   . Iron deficiency anemia      AXIS IV other psychosocial or environmental problems  AXIS V 51-60 moderate symptoms   Treatment Plan/Recommendations:  Laboratory:  Vitamin D follow up at the next visit  Psychotherapy: CBT  Medications: Effexor and Inderal as well as Aricept  Routine PRN Medications:  No  Consultations: none  Safety Concerns:  none  Other:     Plan/Discussion: I took her vitals.  I reviewed CC, tobacco/med/surg Hx, meds effects/ side effects, problem list, therapies and responses as well as current situation/symptoms discussed options. The patient needs to get in with OB/GYN as soon as possible to discuss options regarding menopausal symptoms. The Inderal will be continued as it has been helpful. The SSRIs have been the most helpful for hormonally mediated symptoms of depression and anxiety so we'll start low dose of Lexapro. She'll also start counseling here and  return in four-weeks See orders and pt instructions for more details.  MEDICATIONS this encounter: Meds ordered this encounter  Medications  . propranolol (INDERAL) 10 MG tablet    Sig: Take 1 tablet (10 mg total) by mouth 4 (four) times daily.    Dispense:  120 tablet    Refill:  2  . escitalopram (LEXAPRO) 10 MG tablet    Sig: Take 1 tablet (10 mg total) by mouth daily.    Dispense:  30 tablet    Refill:  2   Medical Decision Making Problem Points:  Established problem, stable/improving (1), Established problem, worsening (2), Review of last therapy session (1) and Review of psycho-social stressors (1) Data Points:  Review or order clinical  lab tests (1) Review of medication regiment & side effects (2) Review of new medications or change in dosage (2)  I certify that outpatient services furnished can reasonably be expected to improve the patient's condition.   [name], MD, [facility]

## 2013-07-29 ENCOUNTER — Ambulatory Visit (INDEPENDENT_AMBULATORY_CARE_PROVIDER_SITE_OTHER): Payer: PRIVATE HEALTH INSURANCE | Admitting: Psychiatry

## 2013-07-29 ENCOUNTER — Encounter (HOSPITAL_COMMUNITY): Payer: Self-pay | Admitting: Psychiatry

## 2013-07-29 VITALS — BP 150/88 | Ht 65.0 in | Wt 179.0 lb

## 2013-07-29 DIAGNOSIS — F410 Panic disorder [episodic paroxysmal anxiety] without agoraphobia: Secondary | ICD-10-CM

## 2013-07-29 DIAGNOSIS — F411 Generalized anxiety disorder: Secondary | ICD-10-CM

## 2013-07-29 DIAGNOSIS — F329 Major depressive disorder, single episode, unspecified: Secondary | ICD-10-CM

## 2013-07-29 MED ORDER — ESCITALOPRAM OXALATE 20 MG PO TABS: 20.0000 mg | ORAL_TABLET | Freq: Every day | ORAL | Status: DC

## 2013-07-29 NOTE — Progress Notes (Signed)
Patient ID: [place], female   DOB: [birth year], 47 y.o.   MRN: [id number] Patient ID: [place], female   DOB: 08-24-1966, 47 y.o.   MRN: [id number] [facility] [id number] Progress Note JARED CAHN MRN: [id number] DOB: [birth year] Age: 47 y.o.  Date: 07/29/2013 Start Time: 2:50 PM End Time: 3:21 PM  Chief Complaint: Chief Complaint  Patient presents with  . Anxiety  . Depression  . Follow-up   Subjective: This patient is a 47 year old married white female who lives with her husband and 52 year old son in [place]. 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  The patient states that she has has significant anxiety and depression for the last 3 years. 3 years ago her mother and her brother both died of cancer and she had been her primary caregiver. Her father had died several years prior and now she is the only one left. She's finds that she cries easily and gets anxious and flustered. She still has panic attacks. She also is probably starting to go through menopause she's not had a period in several months. She will be a beginning with her gynecologist until December. She's having frequent hot flashes.  She returns after four-week's. She's no longer on Inderal. She started the Lexapro which is helping a little bit. She is less anxious and has more energy. She's under a lot of stress because her husband's renal function tests have been elevated and he might need to have it yet another kidney transplant. He is having a biopsy this week. She is trying to stay active and go the gym with her son. She does feel like she might benefit from an increased crease in the medication  History of Chief Complaint:   See HPI:  Anxiety Presents for initial visit. Onset was more than 5 years ago. The problem has been waxing and waning. Symptoms include chest pain, confusion, decreased concentration, depressed mood, dizziness, excessive worry, feeling of choking,  hyperventilation, irritability, malaise, muscle tension, nausea, nervous/anxious behavior, palpitations, panic and shortness of breath. Patient reports no compulsions, dry mouth, insomnia, obsessions, restlessness or suicidal ideas. Symptoms occur constantly. The severity of symptoms is causing significant distress. The symptoms are aggravated by caffeine, family issues, social activities and work stress. The patient sleeps 13 hours per night. The quality of sleep is non-restorative. Nighttime awakenings: occasional.   Risk factors include family history, a major life event and marital problems. Her past medical history is significant for anxiety/panic attacks, arrhythmia and depression. There is no history of anemia, asthma, bipolar disorder, CAD, CHF, chronic lung disease, fibromyalgia, hyperthyroidism or suicide attempts. Past treatments include non-SSRI antidepressants and herbal remedies. The treatment provided mild relief. Compliance with prior treatments has been good.   Review of Systems  Constitutional: Positive for irritability, fatigue and unexpected weight change. Negative for fever, chills, diaphoresis, activity change and appetite change.  HENT: Positive for sneezing. Negative for congestion, dental problem, drooling, ear discharge, ear pain, facial swelling, hearing loss, mouth sores, nosebleeds, postnasal drip, rhinorrhea, sinus pressure, sore throat, tinnitus, trouble swallowing and voice change.   Eyes: Negative.   Respiratory: Positive for chest tightness and shortness of breath. Negative for apnea, cough, choking, wheezing and stridor.   Cardiovascular: Positive for chest pain and palpitations. Negative for leg swelling.  Gastrointestinal: Positive for nausea. Negative for vomiting, abdominal pain, diarrhea, constipation, blood in stool, abdominal distention, anal bleeding and rectal pain.  Genitourinary: Negative.   Musculoskeletal: Negative.  Negative for neck pain  and neck  stiffness.  Skin: Negative.   Neurological: Positive for dizziness and light-headedness. Negative for tremors, seizures, syncope, facial asymmetry, speech difficulty, weakness, numbness and headaches.  Psychiatric/Behavioral: Positive for confusion, sleep disturbance, dysphoric mood, decreased concentration and agitation. Negative for suicidal ideas, hallucinations, behavioral problems and self-injury. The patient is nervous/anxious. The patient does not have insomnia and is not hyperactive.   Skin: gets acne break outs when nervous.  Physical Exam  Depressive Symptoms: anhedonia, hypersomnia, psychomotor agitation, fatigue, difficulty concentrating, hopelessness, impaired memory, anxiety, panic attacks, weight gain, decreased labido, increased appetite,  (Hypo) Manic Symptoms:   Elevated Mood:  No Irritable Mood:  Yes Grandiosity:  No Distractibility:  Yes Labiality of Mood:  Yes Delusions:  No Hallucinations:  No Impulsivity:  No Sexually Inappropriate Behavior:  No Financial Extravagance:  No Flight of Ideas:  No  Anxiety Symptoms: Excessive Worry:  Yes Panic Symptoms:  Yes Agoraphobia:  No Obsessive Compulsive: No  Symptoms:  Specific Phobias:  No Social Anxiety:  No  Psychotic Symptoms:  Hallucinations: No  Delusions:  No Paranoia:  No   Ideas of Reference:  No  PTSD Symptoms: Ever had a traumatic exposure:  No Had a traumatic exposure in the last month:  No Re-experiencing: No  Hypervigilance:  No Hyperarousal: No  Avoidance: No   Traumatic Brain Injury: No   Past Psychiatric History: Diagnosis: Anxiety and Depression  Hospitalizations: none  Outpatient Care: PCP  Substance Abuse Care: none  Self-Mutilation: none  Suicidal Attempts: none  Violent Behaviors: none   Past Medical History:   Past Medical History  Diagnosis Date  . Anxiety   . Depression   . Iron deficiency anemia    History of Loss of Consciousness:  No Seizure History:   No Cardiac History:  No Allergies:   Allergies  Allergen Reactions  . Penicillins Other (See Comments)    Joint swelling    Current Medications:  Current Outpatient Prescriptions  Medication Sig Dispense Refill  . escitalopram (LEXAPRO) 20 MG tablet Take 1 tablet (20 mg total) by mouth daily.  30 tablet  2   No current facility-administered medications for this visit.    Previous Psychotropic Medications:  Medication Dose   Venlafaxin  37.5 mg BID   Substance Abuse History in the last 12 months: Substance Age of 1st Use Last Use Amount Specific Type  Nicotine  15  20      Alcohol  teens  teens      Cannabis  none        Opiates  none        Cocaine  none        Methamphetamines  none        LSD  none        Ecstasy  none         Benzodiazepines  none        Caffeine  childhood  1 hours ago  20 oz  Diet Coke  Inhalants  none        Others:       Sugar  childhood  today  handful  chocolate covered nuts  Medical Consequences of Substance Abuse: weight gain Legal Consequences of Substance Abuse: none Family Consequences of Substance Abuse: none Blackouts:  No DT's:  No Withdrawal Symptoms:  No   Social History: Current Place of Residence: *[address] [place] [address] Place of Birth: [place], [place] Family Members: husband and son Marital Status:  Married Children: 1  Sons: 1  Daughters: 0 Relationships: husband and son Education:  HS Print production planner Problems/Performance: concentration, focus, retention Religious Beliefs/Practices: Advice worker History of Abuse: none Occupational Experiences; Pensions consultant History:  None. Legal History: none Hobbies/Interests: reading walking and music  Family History:   Family History  Problem Relation Age of Onset  . Anxiety disorder Mother   . Cancer Mother   . Depression Mother   . Alcohol abuse Father   . Anxiety disorder Father   . Cancer Father   . Depression Father   . Alcohol  abuse Brother   . Anxiety disorder Brother   . Cancer Brother   . Dementia Maternal Grandmother   . ADD / ADHD Neg Hx   . Bipolar disorder Neg Hx   . Drug abuse Neg Hx   . OCD Neg Hx   . Paranoid behavior Neg Hx   . Schizophrenia Neg Hx   . Seizures Neg Hx   . Sexual abuse Neg Hx   . Physical abuse Neg Hx   . Anxiety disorder Son   . Exostosis Son     Mental Status Examination/Evaluation: Objective:  Appearance: Casual  Eye Contact::  Good  Speech:  Clear and Coherent  Volume:  Normal  Mood:  More upbeat today   Affect:  Congruent  Thought Process:  Coherent, Intact and Logical  Orientation:  Full (Time, Place, and Person)  Thought Content:  WDL  Suicidal Thoughts:  No  Homicidal Thoughts:  No  Judgement:  Good  Insight:  Good  Psychomotor Activity:  Normal  Akathisia:  No  Handed:  Left  AIMS (if indicated):    Assets:  Communication Skills Desire for Improvement   Assessment:    AXIS I Depressive Disorder NOS, Generalized Anxiety Disorder and Panic Disorder  AXIS II Deferred  AXIS III Past Medical History  Diagnosis Date  . Anxiety   . Depression   . Iron deficiency anemia      AXIS IV other psychosocial or environmental problems  AXIS V 51-60 moderate symptoms   Treatment Plan/Recommendations:  Laboratory:    Psychotherapy: CBT  Medications: Lexapro 10 mg every morning   Routine PRN Medications:  No  Consultations: none  Safety Concerns:  none  Other:     Plan/Discussion: I took her vitals.  I reviewed CC, tobacco/med/surg Hx, meds effects/ side effects, problem list, therapies and responses as well as current situation/symptoms discussed options. The patient needs to get in with OB/GYN as soon as possible to discuss options regarding menopausal symptoms. The patient will increase Lexapro to 20 mg every morning. She'll return in 6 weeks. She'll also start counseling here and return in four-weeks See orders and pt instructions for more  details.  MEDICATIONS this encounter: Meds ordered this encounter  Medications  . escitalopram (LEXAPRO) 20 MG tablet    Sig: Take 1 tablet (20 mg total) by mouth daily.    Dispense:  30 tablet    Refill:  2   Medical Decision Making Problem Points:  Established problem, stable/improving (1), Established problem, worsening (2), Review of last therapy session (1) and Review of psycho-social stressors (1) Data Points:  Review or order clinical lab tests (1) Review of medication regiment & side effects (2) Review of new medications or change in dosage (2)  I certify that outpatient services furnished can reasonably be expected to improve the patient's condition.   [name], MD, [facility]

## 2013-09-16 ENCOUNTER — Encounter (HOSPITAL_COMMUNITY): Payer: Self-pay | Admitting: Psychiatry

## 2013-09-16 ENCOUNTER — Ambulatory Visit (INDEPENDENT_AMBULATORY_CARE_PROVIDER_SITE_OTHER): Payer: PRIVATE HEALTH INSURANCE | Admitting: Psychiatry

## 2013-09-16 VITALS — BP 140/88 | Ht 65.0 in | Wt 169.0 lb

## 2013-09-16 DIAGNOSIS — F329 Major depressive disorder, single episode, unspecified: Secondary | ICD-10-CM

## 2013-09-16 DIAGNOSIS — F419 Anxiety disorder, unspecified: Principal | ICD-10-CM

## 2013-09-16 DIAGNOSIS — F410 Panic disorder [episodic paroxysmal anxiety] without agoraphobia: Secondary | ICD-10-CM

## 2013-09-16 DIAGNOSIS — F3289 Other specified depressive episodes: Secondary | ICD-10-CM

## 2013-09-16 DIAGNOSIS — F411 Generalized anxiety disorder: Secondary | ICD-10-CM

## 2013-09-16 MED ORDER — ESCITALOPRAM OXALATE 20 MG PO TABS: 20.0000 mg | ORAL_TABLET | Freq: Every day | ORAL | Status: DC

## 2013-09-16 NOTE — Progress Notes (Signed)
Patient ID: KIMIA FINAN, female   DOB: [birth year], 48 y.o.   MRN: [id number] Patient ID: AKEYLA MOLDEN, female   DOB: 1965/10/15, 48 y.o.   MRN: [id number] Patient ID: JANICIA MONTERROSA, female   DOB: 29-May-1966, 48 y.o.   MRN: [id number] [facility] [id number] Progress Note ZOE CREASMAN MRN: [id number] DOB: 1966-03-12 Age: 48 y.o.  Date: 09/16/2013 Start Time: 2:50 PM End Time: 3:21 PM  Chief Complaint: Chief Complaint  Patient presents with  . Anxiety  . Depression  . Follow-up   Subjective: This patient is a 48 year old married white female who lives with her husband and 26 year old son in [place]. 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  The patient states that she has has significant anxiety and depression for the last 3 years. 3 years ago her mother and her brother both died of cancer and she had been her primary caregiver. Her father had died several years prior and now she is the only one left. She's finds that she cries easily and gets anxious and flustered. She still has panic attacks. She also is probably starting to go through menopause she's not had a period in several months. She will be a beginning with her gynecologist until December. She's having frequent hot flashes.  She returns after 2 months. She's doing better on a higher dose of Lexapro. She's trying not to be so negative. She's no longer having the hot flashes or panic attacks and her mood is improved. She still tends to worry about things when she lies down at night. She's not yet started counseling and she obviously needs to. Her OB/GYN feels like she is beginning menopause at this point and is given her an estrogen cream to use vaginally.  History of Chief Complaint:   See HPI:  Anxiety Presents for initial visit. Onset was more than 5 years ago. The problem has been waxing and waning. Symptoms include chest pain, confusion, decreased concentration, depressed mood, dizziness,  excessive worry, feeling of choking, hyperventilation, irritability, malaise, muscle tension, nausea, nervous/anxious behavior, palpitations, panic and shortness of breath. Patient reports no compulsions, dry mouth, insomnia, obsessions, restlessness or suicidal ideas. Symptoms occur constantly. The severity of symptoms is causing significant distress. The symptoms are aggravated by caffeine, family issues, social activities and work stress. The patient sleeps 13 hours per night. The quality of sleep is non-restorative. Nighttime awakenings: occasional.   Risk factors include family history, a major life event and marital problems. Her past medical history is significant for anxiety/panic attacks, arrhythmia and depression. There is no history of anemia, asthma, bipolar disorder, CAD, CHF, chronic lung disease, fibromyalgia, hyperthyroidism or suicide attempts. Past treatments include non-SSRI antidepressants and herbal remedies. The treatment provided mild relief. Compliance with prior treatments has been good.   Review of Systems  Constitutional: Positive for irritability, fatigue and unexpected weight change. Negative for fever, chills, diaphoresis, activity change and appetite change.  HENT: Positive for sneezing. Negative for congestion, dental problem, drooling, ear discharge, ear pain, facial swelling, hearing loss, mouth sores, nosebleeds, postnasal drip, rhinorrhea, sinus pressure, sore throat, tinnitus, trouble swallowing and voice change.   Eyes: Negative.   Respiratory: Positive for chest tightness and shortness of breath. Negative for apnea, cough, choking, wheezing and stridor.   Cardiovascular: Positive for chest pain and palpitations. Negative for leg swelling.  Gastrointestinal: Positive for nausea. Negative for vomiting, abdominal pain, diarrhea, constipation, blood in stool, abdominal distention, anal bleeding and rectal pain.  Genitourinary: Negative.  Musculoskeletal: Negative.   Negative for neck pain and neck stiffness.  Skin: Negative.   Neurological: Positive for dizziness and light-headedness. Negative for tremors, seizures, syncope, facial asymmetry, speech difficulty, weakness, numbness and headaches.  Psychiatric/Behavioral: Positive for confusion, sleep disturbance, dysphoric mood, decreased concentration and agitation. Negative for suicidal ideas, hallucinations, behavioral problems and self-injury. The patient is nervous/anxious. The patient does not have insomnia and is not hyperactive.   Skin: gets acne break outs when nervous.  Physical Exam  Depressive Symptoms: anhedonia, hypersomnia, psychomotor agitation, fatigue, difficulty concentrating, hopelessness, impaired memory, anxiety, panic attacks, weight gain, decreased labido, increased appetite,  (Hypo) Manic Symptoms:   Elevated Mood:  No Irritable Mood:  Yes Grandiosity:  No Distractibility:  Yes Labiality of Mood:  Yes Delusions:  No Hallucinations:  No Impulsivity:  No Sexually Inappropriate Behavior:  No Financial Extravagance:  No Flight of Ideas:  No  Anxiety Symptoms: Excessive Worry:  Yes Panic Symptoms:  Yes Agoraphobia:  No Obsessive Compulsive: No  Symptoms:  Specific Phobias:  No Social Anxiety:  No  Psychotic Symptoms:  Hallucinations: No  Delusions:  No Paranoia:  No   Ideas of Reference:  No  PTSD Symptoms: Ever had a traumatic exposure:  No Had a traumatic exposure in the last month:  No Re-experiencing: No  Hypervigilance:  No Hyperarousal: No  Avoidance: No   Traumatic Brain Injury: No   Past Psychiatric History: Diagnosis: Anxiety and Depression  Hospitalizations: none  Outpatient Care: PCP  Substance Abuse Care: none  Self-Mutilation: none  Suicidal Attempts: none  Violent Behaviors: none   Past Medical History:   Past Medical History  Diagnosis Date  . Anxiety   . Depression   . Iron deficiency anemia    History of Loss of  Consciousness:  No Seizure History:  No Cardiac History:  No Allergies:   Allergies  Allergen Reactions  . Penicillins Other (See Comments)    Joint swelling    Current Medications:  Current Outpatient Prescriptions  Medication Sig Dispense Refill  . escitalopram (LEXAPRO) 20 MG tablet Take 1 tablet (20 mg total) by mouth daily.  30 tablet  2   No current facility-administered medications for this visit.    Previous Psychotropic Medications:  Medication Dose   Venlafaxin  37.5 mg BID   Substance Abuse History in the last 12 months: Substance Age of 1st Use Last Use Amount Specific Type  Nicotine  15  20      Alcohol  teens  teens      Cannabis  none        Opiates  none        Cocaine  none        Methamphetamines  none        LSD  none        Ecstasy  none         Benzodiazepines  none        Caffeine  childhood  1 hours ago  20 oz  Diet Coke  Inhalants  none        Others:       Sugar  childhood  today  handful  chocolate covered nuts  Medical Consequences of Substance Abuse: weight gain Legal Consequences of Substance Abuse: none Family Consequences of Substance Abuse: none Blackouts:  No DT's:  No Withdrawal Symptoms:  No   Social History: Current Place of Residence: *[address] 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 Legal History: none Hobbies/Interests: reading walking and music  Family History:   Family History  Problem Relation Age of Onset  . Anxiety disorder Mother   . Cancer Mother   . Depression Mother   . Alcohol abuse Father   . Anxiety disorder Father   . Cancer  Father   . Depression Father   . Alcohol abuse Brother   . Anxiety disorder Brother   . Cancer Brother   . Dementia Maternal Grandmother   . ADD / ADHD Neg Hx   . Bipolar disorder Neg Hx   . Drug abuse Neg Hx   . OCD Neg Hx   . Paranoid behavior Neg Hx   . Schizophrenia Neg Hx   . Seizures Neg Hx   . Sexual abuse Neg Hx   . Physical abuse Neg Hx   . Anxiety disorder Son   . Exostosis Son     Mental Status Examination/Evaluation: Objective:  Appearance: Casual  Eye Contact::  Good  Speech:  Clear and Coherent  Volume:  Normal  Mood:  More upbeat today   Affect:  Congruent  Thought Process:  Coherent, Intact and Logical  Orientation:  Full (Time, Place, and Person)  Thought Content:  WDL  Suicidal Thoughts:  No  Homicidal Thoughts:  No  Judgement:  Good  Insight:  Good  Psychomotor Activity:  Normal  Akathisia:  No  Handed:  Left  AIMS (if indicated):    Assets:  Communication Skills Desire for Improvement   Assessment:    AXIS I Depressive Disorder NOS, Generalized Anxiety Disorder and Panic Disorder  AXIS II Deferred  AXIS III Past Medical History  Diagnosis Date  . Anxiety   . Depression   . Iron deficiency anemia      AXIS IV other psychosocial or environmental problems  AXIS V 51-60 moderate symptoms   Treatment Plan/Recommendations:  Laboratory:    Psychotherapy: CBT  Medications: Lexapro 20 mg every morning   Routine PRN Medications:  No  Consultations: none  Safety Concerns:  none  Other:     Plan/Discussion: I took her vitals.  I reviewed CC, tobacco/med/surg Hx, meds effects/ side effects, problem list, therapies and responses as well as current situation/symptoms discussed options. The patient is doing well on her current medications-Lexapro 20 mg every morning. She still needs to start cognitive therapy and this will be set up today. See orders and pt instructions for more details.  MEDICATIONS this encounter: Meds ordered this encounter   Medications  . escitalopram (LEXAPRO) 20 MG tablet    Sig: Take 1 tablet (20 mg total) by mouth daily.    Dispense:  30 tablet    Refill:  2   Medical Decision Making Problem Points:  Established problem, stable/improving (1), Established problem, worsening (2), Review of last therapy session (1) and Review of psycho-social stressors (1) Data Points:  Review or order clinical lab tests (1) Review of medication regiment & side effects (2) Review of new medications or change in dosage (2)  I certify that outpatient services furnished can reasonably be expected to improve the patient's condition.   Diannia RuderOSS, Jiovanna Frei, MD

## 2013-10-14 ENCOUNTER — Telehealth (HOSPITAL_COMMUNITY): Payer: Self-pay | Admitting: *Deleted

## 2013-10-14 ENCOUNTER — Ambulatory Visit (HOSPITAL_COMMUNITY): Payer: Self-pay | Admitting: Psychiatry

## 2013-12-10 ENCOUNTER — Encounter (HOSPITAL_COMMUNITY): Payer: Self-pay | Admitting: Psychiatry

## 2013-12-10 ENCOUNTER — Ambulatory Visit (INDEPENDENT_AMBULATORY_CARE_PROVIDER_SITE_OTHER): Payer: PRIVATE HEALTH INSURANCE | Admitting: Psychiatry

## 2013-12-10 VITALS — BP 120/80 | Ht 65.0 in | Wt 169.0 lb

## 2013-12-10 DIAGNOSIS — F419 Anxiety disorder, unspecified: Principal | ICD-10-CM

## 2013-12-10 DIAGNOSIS — F3289 Other specified depressive episodes: Secondary | ICD-10-CM

## 2013-12-10 DIAGNOSIS — F411 Generalized anxiety disorder: Secondary | ICD-10-CM

## 2013-12-10 DIAGNOSIS — F32A Depression, unspecified: Secondary | ICD-10-CM

## 2013-12-10 DIAGNOSIS — F410 Panic disorder [episodic paroxysmal anxiety] without agoraphobia: Secondary | ICD-10-CM

## 2013-12-10 DIAGNOSIS — F329 Major depressive disorder, single episode, unspecified: Secondary | ICD-10-CM

## 2013-12-10 MED ORDER — ESCITALOPRAM OXALATE 20 MG PO TABS: 20.0000 mg | ORAL_TABLET | Freq: Every day | ORAL | Status: DC

## 2013-12-10 NOTE — Progress Notes (Signed)
Patient ID: Whitney NapoleonKimberly M Waight, female   DOB: July 06, 1966, 48 y.o.   MRN: [id number] Patient ID: Whitney NapoleonKimberly M Guggenheim, female   DOB: July 06, 1966, 48 y.o.   MRN: [id number] Patient ID: Whitney NapoleonKimberly M Louks, female   DOB: July 06, 1966, 48 y.o.   MRN: [id number] Patient ID: Whitney NapoleonKimberly M Darrough, female   DOB: July 06, 1966, 48 y.o.   MRN: [id number] [facility] [id number] Progress Note Whitney Valentine MRN: [id number] DOB: July 06, 1966 Age: 48 y.o.  Date: 12/10/2013 Start Time: 2:50 PM End Time: 3:21 PM  Chief Complaint: Chief Complaint  Patient presents with  . Anxiety  . Depression  . Follow-up   Subjective: This patient is a 48 year old married white female who lives with her husband and 50 year old son in [place]. 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  The patient states that she has has significant anxiety and depression for the last 3 years. 3 years ago her mother and her brother both died of cancer and she had been her primary caregiver. Her father had died several years prior and now she is the only one left. She's finds that she cries easily and gets anxious and flustered. She still has panic attacks. She also is probably starting to go through menopause she's not had a period in several months. She will be a beginning with her gynecologist until December. She's having frequent hot flashes.  She returns after 3 months. Her OB/GYN is put her on Loestrin and this is helping her hot flashes and difficulty sleeping. In combination with the Lexapro she is feeling much calmer and on an even keel. She denies being depressed or anxious right now. Her hot flashes are diminished and her mood is good :  Anxiety Presents for initial visit. Onset was more than 5 years ago. The problem has been waxing and waning. Symptoms include chest pain, confusion, decreased concentration, depressed mood, dizziness, excessive worry, feeling of choking, hyperventilation, irritability, malaise, muscle  tension, nausea, nervous/anxious behavior, palpitations, panic and shortness of breath. Patient reports no compulsions, dry mouth, insomnia, obsessions, restlessness or suicidal ideas. Symptoms occur constantly. The severity of symptoms is causing significant distress. The symptoms are aggravated by caffeine, family issues, social activities and work stress. The patient sleeps 13 hours per night. The quality of sleep is non-restorative. Nighttime awakenings: occasional.   Risk factors include family history, a major life event and marital problems. Her past medical history is significant for anxiety/panic attacks, arrhythmia and depression. There is no history of anemia, asthma, bipolar disorder, CAD, CHF, chronic lung disease, fibromyalgia, hyperthyroidism or suicide attempts. Past treatments include non-SSRI antidepressants and herbal remedies. The treatment provided mild relief. Compliance with prior treatments has been good.   Review of Systems  Constitutional: Positive for irritability, fatigue and unexpected weight change. Negative for fever, chills, diaphoresis, activity change and appetite change.  HENT: Positive for sneezing. Negative for congestion, dental problem, drooling, ear discharge, ear pain, facial swelling, hearing loss, mouth sores, nosebleeds, postnasal drip, rhinorrhea, sinus pressure, sore throat, tinnitus, trouble swallowing and voice change.   Eyes: Negative.   Respiratory: Positive for chest tightness and shortness of breath. Negative for apnea, cough, choking, wheezing and stridor.   Cardiovascular: Positive for chest pain and palpitations. Negative for leg swelling.  Gastrointestinal: Positive for nausea. Negative for vomiting, abdominal pain, diarrhea, constipation, blood in stool, abdominal distention, anal bleeding and rectal pain.  Genitourinary: Negative.   Musculoskeletal: Negative.  Negative for neck pain and neck stiffness.  Skin: Negative.  Neurological: Positive  for dizziness and light-headedness. Negative for tremors, seizures, syncope, facial asymmetry, speech difficulty, weakness, numbness and headaches.  Psychiatric/Behavioral: Positive for confusion, sleep disturbance, dysphoric mood, decreased concentration and agitation. Negative for suicidal ideas, hallucinations, behavioral problems and self-injury. The patient is nervous/anxious. The patient does not have insomnia and is not hyperactive.   Skin: gets acne break outs when nervous.  Physical Exam  Depressive Symptoms: anhedonia, hypersomnia, psychomotor agitation, fatigue, difficulty concentrating, hopelessness, impaired memory, anxiety, panic attacks, weight gain, decreased labido, increased appetite,  (Hypo) Manic Symptoms:   Elevated Mood:  No Irritable Mood:  Yes Grandiosity:  No Distractibility:  Yes Labiality of Mood:  Yes Delusions:  No Hallucinations:  No Impulsivity:  No Sexually Inappropriate Behavior:  No Financial Extravagance:  No Flight of Ideas:  No  Anxiety Symptoms: Excessive Worry:  Yes Panic Symptoms:  Yes Agoraphobia:  No Obsessive Compulsive: No  Symptoms:  Specific Phobias:  No Social Anxiety:  No  Psychotic Symptoms:  Hallucinations: No  Delusions:  No Paranoia:  No   Ideas of Reference:  No  PTSD Symptoms: Ever had a traumatic exposure:  No Had a traumatic exposure in the last month:  No Re-experiencing: No  Hypervigilance:  No Hyperarousal: No  Avoidance: No   Traumatic Brain Injury: No   Past Psychiatric History: Diagnosis: Anxiety and Depression  Hospitalizations: none  Outpatient Care: PCP  Substance Abuse Care: none  Self-Mutilation: none  Suicidal Attempts: none  Violent Behaviors: none   Past Medical History:   Past Medical History  Diagnosis Date  . Anxiety   . Depression   . Iron deficiency anemia    History of Loss of Consciousness:  No Seizure History:  No Cardiac History:  No Allergies:   Allergies   Allergen Reactions  . Penicillins Other (See Comments)    Joint swelling    Current Medications:  Current Outpatient Prescriptions  Medication Sig Dispense Refill  . escitalopram (LEXAPRO) 20 MG tablet Take 1 tablet (20 mg total) by mouth daily.  30 tablet  3   No current facility-administered medications for this visit.    Previous Psychotropic Medications:  Medication Dose   Venlafaxin  37.5 mg BID   Substance Abuse History in the last 12 months: Substance Age of 1st Use Last Use Amount Specific Type  Nicotine  15  20      Alcohol  teens  teens      Cannabis  none        Opiates  none        Cocaine  none        Methamphetamines  none        LSD  none        Ecstasy  none         Benzodiazepines  none        Caffeine  childhood  1 hours ago  20 oz  Diet Coke  Inhalants  none        Others:       Sugar  childhood  today  handful  chocolate covered nuts  Medical Consequences of Substance Abuse: weight gain Legal Consequences of Substance Abuse: none Family Consequences of Substance Abuse: none Blackouts:  No DT's:  No Withdrawal Symptoms:  No   Social History: Current Place of Residence: *[address] [place] [address] Place of Birth: [place], [place] Family Members: husband and son Marital Status:  Married Children: 1  Sons: 1  Daughters: 0 Relationships: husband  and son Education:  HS Graduate Educational Problems/Performance: concentration, focus, retention Religious Beliefs/Practices: Pentecostal Holiness History of Abuse: none Occupational Experiences; Pensions consultantsecretarial and support Military History:  None. Legal History: none Hobbies/Interests: reading walking and music  Family History:   Family History  Problem Relation Age of Onset  . Anxiety disorder Mother   . Cancer Mother   . Depression Mother   . Alcohol abuse Father   . Anxiety disorder Father   . Cancer Father   . Depression Father   . Alcohol abuse Brother   . Anxiety disorder Brother   .  Cancer Brother   . Dementia Maternal Grandmother   . ADD / ADHD Neg Hx   . Bipolar disorder Neg Hx   . Drug abuse Neg Hx   . OCD Neg Hx   . Paranoid behavior Neg Hx   . Schizophrenia Neg Hx   . Seizures Neg Hx   . Sexual abuse Neg Hx   . Physical abuse Neg Hx   . Anxiety disorder Son   . Exostosis Son     Mental Status Examination/Evaluation: Objective:  Appearance: Casual  Eye Contact::  Good  Speech:  Clear and Coherent  Volume:  Normal  Mood:   upbeat today   Affect:  Congruent  Thought Process:  Coherent, Intact and Logical  Orientation:  Full (Time, Place, and Person)  Thought Content:  WDL  Suicidal Thoughts:  No  Homicidal Thoughts:  No  Judgement:  Good  Insight:  Good  Psychomotor Activity:  Normal  Akathisia:  No  Handed:  Left  AIMS (if indicated):    Assets:  Communication Skills Desire for Improvement   Assessment:    AXIS I Depressive Disorder NOS, Generalized Anxiety Disorder and Panic Disorder  AXIS II Deferred  AXIS III Past Medical History  Diagnosis Date  . Anxiety   . Depression   . Iron deficiency anemia      AXIS IV other psychosocial or environmental problems  AXIS V 51-60 moderate symptoms   Treatment Plan/Recommendations:  Laboratory:    Psychotherapy: CBT  Medications: Lexapro 20 mg every morning   Routine PRN Medications:  No  Consultations: none  Safety Concerns:  none  Other:     Plan/Discussion: I took her vitals.  I reviewed CC, tobacco/med/surg Hx, meds effects/ side effects, problem list, therapies and responses as well as current situation/symptoms discussed options. The patient is doing well on her current medications-Lexapro 20 mg every morning. She will return in 4 months See orders and pt instructions for more details.  MEDICATIONS this encounter: Meds ordered this encounter  Medications  . escitalopram (LEXAPRO) 20 MG tablet    Sig: Take 1 tablet (20 mg total) by mouth daily.    Dispense:  30 tablet     Refill:  3   Medical Decision Making Problem Points:  Established problem, stable/improving (1), Established problem, worsening (2), Review of last therapy session (1) and Review of psycho-social stressors (1) Data Points:  Review or order clinical lab tests (1) Review of medication regiment & side effects (2) Review of new medications or change in dosage (2)  I certify that outpatient services furnished can reasonably be expected to improve the patient's condition.   [name] Ross, MD

## 2014-03-08 ENCOUNTER — Other Ambulatory Visit (HOSPITAL_COMMUNITY): Payer: Self-pay | Admitting: Psychiatry

## 2014-04-11 ENCOUNTER — Ambulatory Visit (INDEPENDENT_AMBULATORY_CARE_PROVIDER_SITE_OTHER): Payer: PRIVATE HEALTH INSURANCE | Admitting: Psychiatry

## 2014-04-11 ENCOUNTER — Encounter (HOSPITAL_COMMUNITY): Payer: Self-pay | Admitting: Psychiatry

## 2014-04-11 VITALS — BP 137/77 | HR 80 | Ht 65.0 in | Wt 171.8 lb

## 2014-04-11 DIAGNOSIS — F411 Generalized anxiety disorder: Secondary | ICD-10-CM

## 2014-04-11 DIAGNOSIS — F329 Major depressive disorder, single episode, unspecified: Secondary | ICD-10-CM

## 2014-04-11 DIAGNOSIS — F410 Panic disorder [episodic paroxysmal anxiety] without agoraphobia: Secondary | ICD-10-CM

## 2014-04-11 DIAGNOSIS — F419 Anxiety disorder, unspecified: Principal | ICD-10-CM

## 2014-04-11 DIAGNOSIS — F3289 Other specified depressive episodes: Secondary | ICD-10-CM

## 2014-04-11 DIAGNOSIS — F32A Depression, unspecified: Secondary | ICD-10-CM

## 2014-04-11 MED ORDER — ESCITALOPRAM OXALATE 20 MG PO TABS: 20.0000 mg | ORAL_TABLET | Freq: Every day | ORAL | Status: DC

## 2014-04-11 NOTE — Progress Notes (Signed)
Patient ID: Whitney Valentine, female   DOB: [birth year], 48 y.o.   MRN: [id number] Patient ID: Whitney Valentine, female   DOB: 03-23-1966, 48 y.o.   MRN: [id number] Patient ID: Whitney Valentine, female   DOB: 05/08/1966, 48 y.o.   MRN: [id number] Patient ID: Whitney Valentine, female   DOB: May 02, 1966, 48 y.o.   MRN: [id number] Patient ID: Whitney Valentine, female   DOB: [birth year], 48 y.o.   MRN: [id number] [facility] [address] Progress Note Whitney Valentine MRN: [id number] DOB: [birth year] Age: 48 y.o.  Date: 04/11/2014 Start Time: 2:50 PM End Time: 3:21 PM  Chief Complaint: Chief Complaint  Patient presents with  . Anxiety  . Depression  . Follow-up   Subjective: This patient is a 48 year old married white female who lives with her husband and 61 year old son in [place]. 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  The patient states that she has has significant anxiety and depression for the last 3 years. 3 years ago her mother and her brother both died of cancer and she had been her primary caregiver. Her father had died several years prior and now she is the only one left. She's finds that she cries easily and gets anxious and flustered. She still has panic attacks. She also is probably starting to go through menopause she's not had a period in several months. She will be a beginning with her gynecologist until December. She's having frequent hot flashes.  She returns after 4 months. Overall she's doing well. She and her family went on a cruise this summer and she enjoyed it. Her mood has been very stable. She has some irregular bleeding and she's going to see her OB/GYN about it. She continues on Loestrin for now. She's no longer having any hot flashes :  Anxiety Presents for initial visit. Onset was more than 5 years ago. The problem has been waxing and waning. Symptoms include chest pain, confusion, decreased concentration, depressed mood, dizziness,  excessive worry, feeling of choking, hyperventilation, irritability, malaise, muscle tension, nausea, nervous/anxious behavior, palpitations, panic and shortness of breath. Patient reports no compulsions, dry mouth, insomnia, obsessions, restlessness or suicidal ideas. Symptoms occur constantly. The severity of symptoms is causing significant distress. The symptoms are aggravated by caffeine, family issues, social activities and work stress. The patient sleeps 13 hours per night. The quality of sleep is non-restorative. Nighttime awakenings: occasional.   Risk factors include family history, a major life event and marital problems. Her past medical history is significant for anxiety/panic attacks, arrhythmia and depression. There is no history of anemia, asthma, bipolar disorder, CAD, CHF, chronic lung disease, fibromyalgia, hyperthyroidism or suicide attempts. Past treatments include non-SSRI antidepressants and herbal remedies. The treatment provided mild relief. Compliance with prior treatments has been good.   Review of Systems  Constitutional: Positive for irritability, fatigue and unexpected weight change. Negative for fever, chills, diaphoresis, activity change and appetite change.  HENT: Positive for sneezing. Negative for congestion, dental problem, drooling, ear discharge, ear pain, facial swelling, hearing loss, mouth sores, nosebleeds, postnasal drip, rhinorrhea, sinus pressure, sore throat, tinnitus, trouble swallowing and voice change.   Eyes: Negative.   Respiratory: Positive for chest tightness and shortness of breath. Negative for apnea, cough, choking, wheezing and stridor.   Cardiovascular: Positive for chest pain and palpitations. Negative for leg swelling.  Gastrointestinal: Positive for nausea. Negative for vomiting, abdominal pain, diarrhea, constipation, blood in stool, abdominal distention, anal bleeding and rectal pain.  Genitourinary: Negative.  Musculoskeletal: Negative.   Negative for neck pain and neck stiffness.  Skin: Negative.   Neurological: Positive for dizziness and light-headedness. Negative for tremors, seizures, syncope, facial asymmetry, speech difficulty, weakness, numbness and headaches.  Psychiatric/Behavioral: Positive for confusion, sleep disturbance, dysphoric mood, decreased concentration and agitation. Negative for suicidal ideas, hallucinations, behavioral problems and self-injury. The patient is nervous/anxious. The patient does not have insomnia and is not hyperactive.   Skin: gets acne break outs when nervous.  Physical Exam  Depressive Symptoms: anhedonia, hypersomnia, psychomotor agitation, fatigue, difficulty concentrating, hopelessness, impaired memory, anxiety, panic attacks, weight gain, decreased labido, increased appetite,  (Hypo) Manic Symptoms:   Elevated Mood:  No Irritable Mood:  Yes Grandiosity:  No Distractibility:  Yes Labiality of Mood:  Yes Delusions:  No Hallucinations:  No Impulsivity:  No Sexually Inappropriate Behavior:  No Financial Extravagance:  No Flight of Ideas:  No  Anxiety Symptoms: Excessive Worry:  Yes Panic Symptoms:  Yes Agoraphobia:  No Obsessive Compulsive: No  Symptoms:  Specific Phobias:  No Social Anxiety:  No  Psychotic Symptoms:  Hallucinations: No  Delusions:  No Paranoia:  No   Ideas of Reference:  No  PTSD Symptoms: Ever had a traumatic exposure:  No Had a traumatic exposure in the last month:  No Re-experiencing: No  Hypervigilance:  No Hyperarousal: No  Avoidance: No   Traumatic Brain Injury: No   Past Psychiatric History: Diagnosis: Anxiety and Depression  Hospitalizations: none  Outpatient Care: PCP  Substance Abuse Care: none  Self-Mutilation: none  Suicidal Attempts: none  Violent Behaviors: none   Past Medical History:   Past Medical History  Diagnosis Date  . Anxiety   . Depression   . Iron deficiency anemia    History of Loss of  Consciousness:  No Seizure History:  No Cardiac History:  No Allergies:   Allergies  Allergen Reactions  . Penicillins Other (See Comments)    Joint swelling    Current Medications:  Current Outpatient Prescriptions  Medication Sig Dispense Refill  . escitalopram (LEXAPRO) 20 MG tablet Take 1 tablet (20 mg total) by mouth daily.  30 tablet  3  . IRON PO Take 65 mg by mouth daily.      . NON FORMULARY Lolesterin 10 mg QD       No current facility-administered medications for this visit.    Previous Psychotropic Medications:  Medication Dose   Venlafaxin  37.5 mg BID   Substance Abuse History in the last 12 months: Substance Age of 1st Use Last Use Amount Specific Type  Nicotine  15  20      Alcohol  teens  teens      Cannabis  none        Opiates  none        Cocaine  none        Methamphetamines  none        LSD  none        Ecstasy  none         Benzodiazepines  none        Caffeine  childhood  1 hours ago  20 oz  Diet Coke  Inhalants  none        Others:       Sugar  childhood  today  handful  chocolate covered nuts  Medical Consequences of Substance Abuse: weight gain Legal Consequences of Substance Abuse: none Family Consequences of Substance Abuse: none Blackouts:  No DT's:  No Withdrawal  Symptoms:  No   Social History: Current Place of Residence: *[address] [place] [address] Place [place] Birth[place], [place] Family Members: husband and son Marital Status:  Married Children: 1  Sons: 1  Daughters: 0 Relationships: husband and son Education:  HS Print production plannerGraduate Educational Problems/Performance: concentration, focus, retention Religious Beliefs/Practices: Advice workerentecostal Holiness History of Abuse: none Armed forces technical officerccupational Experiences; Pensions consultantsecretarial and support Military History:  None. Legal History: none Hobbies/Interests: reading walking and music  Family History:   Family History  Problem Relation Age of Onset  . Anxiety disorder Mother   . Cancer Mother   .  Depression Mother   . Alcohol abuse Father   . Anxiety disorder Father   . Cancer Father   . Depression Father   . Alcohol abuse Brother   . Anxiety disorder Brother   . Cancer Brother   . Dementia Maternal Grandmother   . ADD / ADHD Neg Hx   . Bipolar disorder Neg Hx   . Drug abuse Neg Hx   . OCD Neg Hx   . Paranoid behavior Neg Hx   . Schizophrenia Neg Hx   . Seizures Neg Hx   . Sexual abuse Neg Hx   . Physical abuse Neg Hx   . Anxiety disorder Son   . Exostosis Son     Mental Status Examination/Evaluation: Objective:  Appearance: Casual  Eye Contact::  Good  Speech:  Clear and Coherent  Volume:  Normal  Mood:   upbeat today   Affect:  Congruent  Thought Process:  Coherent, Intact and Logical  Orientation:  Full (Time, Place, and Person)  Thought Content:  WDL  Suicidal Thoughts:  No  Homicidal Thoughts:  No  Judgement:  Good  Insight:  Good  Psychomotor Activity:  Normal  Akathisia:  No  Handed:  Left  AIMS (if indicated):    Assets:  Communication Skills Desire for Improvement   Assessment:    AXIS I Depressive Disorder NOS, Generalized Anxiety Disorder and Panic Disorder  AXIS II Deferred  AXIS III Past Medical History  Diagnosis Date  . Anxiety   . Depression   . Iron deficiency anemia      AXIS IV other psychosocial or environmental problems  AXIS V 51-60 moderate symptoms   Treatment Plan/Recommendations:  Laboratory:    Psychotherapy: CBT  Medications: Lexapro 20 mg every morning   Routine PRN Medications:  No  Consultations: none  Safety Concerns:  none  Other:     Plan/Discussion: I took her vitals.  I reviewed CC, tobacco/med/surg Hx, meds effects/ side effects, problem list, therapies and responses as well as current situation/symptoms discussed options. The patient is doing well on her current medications-Lexapro 20 mg every morning. She will return in 4 months See orders and pt instructions for more details.  MEDICATIONS this  encounter: Meds ordered this encounter  Medications  . IRON PO    Sig: Take 65 mg by mouth daily.  . NON FORMULARY    Sig: Lolesterin 10 mg QD  . escitalopram (LEXAPRO) 20 MG tablet    Sig: Take 1 tablet (20 mg total) by mouth daily.    Dispense:  30 tablet    Refill:  3   Medical Decision Making Problem Points:  Established problem, stable/improving (1), Established problem, worsening (2), Review of last therapy session (1) and Review of psycho-social stressors (1) Data Points:  Review or order clinical lab tests (1) Review of medication regiment & side effects (2) Review of new medications  or change in dosage (2)  I certify that outpatient services furnished can reasonably be expected to improve the patient's condition.   Whitney Ruder, MD

## 2014-08-11 ENCOUNTER — Encounter (HOSPITAL_COMMUNITY): Payer: Self-pay | Admitting: Psychiatry

## 2014-08-11 ENCOUNTER — Ambulatory Visit (INDEPENDENT_AMBULATORY_CARE_PROVIDER_SITE_OTHER): Payer: PRIVATE HEALTH INSURANCE | Admitting: Psychiatry

## 2014-08-11 VITALS — BP 125/78 | HR 78 | Ht 65.0 in | Wt 168.6 lb

## 2014-08-11 DIAGNOSIS — F419 Anxiety disorder, unspecified: Principal | ICD-10-CM

## 2014-08-11 DIAGNOSIS — F410 Panic disorder [episodic paroxysmal anxiety] without agoraphobia: Secondary | ICD-10-CM

## 2014-08-11 DIAGNOSIS — F411 Generalized anxiety disorder: Secondary | ICD-10-CM

## 2014-08-11 DIAGNOSIS — F329 Major depressive disorder, single episode, unspecified: Secondary | ICD-10-CM

## 2014-08-11 MED ORDER — ESCITALOPRAM OXALATE 20 MG PO TABS: ORAL_TABLET | ORAL | Status: DC

## 2014-08-11 NOTE — Progress Notes (Signed)
Patient ID: Whitney Valentine, female   DOB: 03-19-1966, 48 y.o.   MRN: [id number] Patient ID: Whitney Valentine, female   DOB: 1966-05-06, 48 y.o.   MRN: [id number] Patient ID: Whitney Valentine, female   DOB: October 20, 1965, 48 y.o.   MRN: [id number] Patient ID: Whitney Valentine, female   DOB: 04-15-1966, 48 y.o.   MRN: [id number] Patient ID: Whitney Valentine, female   DOB: August 05, 1966, 48 y.o.   MRN: [id number] Patient ID: Whitney Valentine, female   DOB: 1965/10/10, 48 y.o.   MRN: [id number] [facility] [address] Progress Note Whitney Valentine MRN: [id number] DOB: Aug 23, 1966 Age: 48 y.o.  Date: 08/11/2014 Start Time: 2:50 PM End Time: 3:21 PM  Chief Complaint: Chief Complaint  Patient presents with  . Depression  . Anxiety  . Follow-up   Subjective: This patient is a 48 year old married white female who lives with her husband and 62 year-old son in [place]. 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  The patient states that she has has significant anxiety and depression for the last 3 years. 3 years ago her mother and her brother both died of cancer and she had been her primary caregiver. Her father had died several years prior and now she is the only one left. She's finds that she cries easily and gets anxious and flustered. She still has panic attacks. She also is probably starting to go through menopause she's not had a period in several months. She will be a beginning with her gynecologist until December. She's having frequent hot flashes.  She returns after 4 months. Overall she's doing okay but she feels more sad during the holidays. She can be in a group of people and feels lonely. She really misses her family. She doesn't have anyone in her immediate family who is still alive. She's doing her job and doing everything she needs to. However her level of sadness is definitely increased. I suggested we increase her Lexapro little bit :  Anxiety Presents for  initial visit. Onset was more than 5 years ago. The problem has been waxing and waning. Symptoms include chest pain, confusion, decreased concentration, depressed mood, dizziness, excessive worry, feeling of choking, hyperventilation, irritability, malaise, muscle tension, nausea, nervous/anxious behavior, palpitations, panic and shortness of breath. Patient reports no compulsions, dry mouth, insomnia, obsessions, restlessness or suicidal ideas. Symptoms occur constantly. The severity of symptoms is causing significant distress. The symptoms are aggravated by caffeine, family issues, social activities and work stress. The patient sleeps 13 hours per night. The quality of sleep is non-restorative. Nighttime awakenings: occasional.   Risk factors include family history, a major life event and marital problems. Her past medical history is significant for anxiety/panic attacks, arrhythmia and depression. There is no history of anemia, asthma, bipolar disorder, CAD, CHF, chronic lung disease, fibromyalgia, hyperthyroidism or suicide attempts. Past treatments include non-SSRI antidepressants and herbal remedies. The treatment provided mild relief. Compliance with prior treatments has been good.   Review of Systems  Constitutional: Positive for irritability, fatigue and unexpected weight change. Negative for fever, chills, diaphoresis, activity change and appetite change.  HENT: Positive for sneezing. Negative for congestion, dental problem, drooling, ear discharge, ear pain, facial swelling, hearing loss, mouth sores, nosebleeds, postnasal drip, rhinorrhea, sinus pressure, sore throat, tinnitus, trouble swallowing and voice change.   Eyes: Negative.   Respiratory: Positive for chest tightness and shortness of breath. Negative for apnea, cough, choking, wheezing and stridor.   Cardiovascular: Positive for chest  pain and palpitations. Negative for leg swelling.  Gastrointestinal: Positive for nausea. Negative for  vomiting, abdominal pain, diarrhea, constipation, blood in stool, abdominal distention, anal bleeding and rectal pain.  Genitourinary: Negative.   Musculoskeletal: Negative.  Negative for neck pain and neck stiffness.  Skin: Negative.   Neurological: Positive for dizziness and light-headedness. Negative for tremors, seizures, syncope, facial asymmetry, speech difficulty, weakness, numbness and headaches.  Psychiatric/Behavioral: Positive for confusion, sleep disturbance, dysphoric mood, decreased concentration and agitation. Negative for suicidal ideas, hallucinations, behavioral problems and self-injury. The patient is nervous/anxious. The patient does not have insomnia and is not hyperactive.   Skin: gets acne break outs when nervous.  Physical Exam  Depressive Symptoms: anhedonia, hypersomnia, psychomotor agitation, fatigue, difficulty concentrating, hopelessness, impaired memory, anxiety, panic attacks, weight gain, decreased labido, increased appetite,  (Hypo) Manic Symptoms:   Elevated Mood:  No Irritable Mood:  Yes Grandiosity:  No Distractibility:  Yes Labiality of Mood:  Yes Delusions:  No Hallucinations:  No Impulsivity:  No Sexually Inappropriate Behavior:  No Financial Extravagance:  No Flight of Ideas:  No  Anxiety Symptoms: Excessive Worry:  Yes Panic Symptoms:  Yes Agoraphobia:  No Obsessive Compulsive: No  Symptoms:  Specific Phobias:  No Social Anxiety:  No  Psychotic Symptoms:  Hallucinations: No  Delusions:  No Paranoia:  No   Ideas of Reference:  No  PTSD Symptoms: Ever had a traumatic exposure:  No Had a traumatic exposure in the last month:  No Re-experiencing: No  Hypervigilance:  No Hyperarousal: No  Avoidance: No   Traumatic Brain Injury: No   Past Psychiatric History: Diagnosis: Anxiety and Depression  Hospitalizations: none  Outpatient Care: PCP  Substance Abuse Care: none  Self-Mutilation: none  Suicidal Attempts: none   Violent Behaviors: none   Past Medical History:   Past Medical History  Diagnosis Date  . Anxiety   . Depression   . Iron deficiency anemia    History of Loss of Consciousness:  No Seizure History:  No Cardiac History:  No Allergies:   Allergies  Allergen Reactions  . Penicillins Other (See Comments)    Joint swelling    Current Medications:  Current Outpatient Prescriptions  Medication Sig Dispense Refill  . escitalopram (LEXAPRO) 20 MG tablet Take one and one half tablets daily 45 tablet 2  . IRON PO Take 65 mg by mouth daily.    . NON FORMULARY Lolesterin 10 mg QD     No current facility-administered medications for this visit.    Previous Psychotropic Medications:  Medication Dose   Venlafaxin  37.5 mg BID   Substance Abuse History in the last 12 months: Substance Age of 1st Use Last Use Amount Specific Type  Nicotine  15  20      Alcohol  teens  teens      Cannabis  none        Opiates  none        Cocaine  none        Methamphetamines  none        LSD  none        Ecstasy  none         Benzodiazepines  none        Caffeine  childhood  1 hours ago  20 oz  Diet Coke  Inhalants  none        Others:       Sugar  childhood  today  handful  chocolate covered nuts  Medical Consequences of Substance Abuse: weight gain Legal Consequences of Substance Abuse: none Family Consequences of Substance Abuse: none Blackouts:  No DT's:  No Withdrawal Symptoms:  No   Social History: Current Place of Residence: *[address] [place] [address] Place of Birth: [place], [place] Family Members: husband and son Marital Status:  Married Children: 1  Sons: 1  Daughters: 0 Relationships: husband and son Education:  HS Print production planner Problems/Performance: concentration, focus, retention Religious Beliefs/Practices: Advice worker History of Abuse: none Armed forces technical officer; Pensions consultant History:  None. Legal History:  none Hobbies/Interests: reading walking and music  Family History:   Family History  Problem Relation Age of Onset  . Anxiety disorder Mother   . Cancer Mother   . Depression Mother   . Alcohol abuse Father   . Anxiety disorder Father   . Cancer Father   . Depression Father   . Alcohol abuse Brother   . Anxiety disorder Brother   . Cancer Brother   . Dementia Maternal Grandmother   . ADD / ADHD Neg Hx   . Bipolar disorder Neg Hx   . Drug abuse Neg Hx   . OCD Neg Hx   . Paranoid behavior Neg Hx   . Schizophrenia Neg Hx   . Seizures Neg Hx   . Sexual abuse Neg Hx   . Physical abuse Neg Hx   . Anxiety disorder Son   . Exostosis Son     Mental Status Examination/Evaluation: Objective:  Appearance: Casual  Eye Contact::  Good  Speech:  Clear and Coherent  Volume:  Normal  Mood:   Somewhat sad today   Affect:  Congruent  Thought Process:  Coherent, Intact and Logical  Orientation:  Full (Time, Place, and Person)  Thought Content:  WDL  Suicidal Thoughts:  No  Homicidal Thoughts:  No  Judgement:  Good  Insight:  Good  Psychomotor Activity:  Normal  Akathisia:  No  Handed:  Left  AIMS (if indicated):    Assets:  Communication Skills Desire for Improvement   Assessment:    AXIS I Depressive Disorder NOS, Generalized Anxiety Disorder and Panic Disorder  AXIS II Deferred  AXIS III Past Medical History  Diagnosis Date  . Anxiety   . Depression   . Iron deficiency anemia      AXIS IV other psychosocial or environmental problems  AXIS V 51-60 moderate symptoms   Treatment Plan/Recommendations:  Laboratory:    Psychotherapy: CBT  Medications increase Lexapro to 30  mg every morning   Routine PRN Medications:  No  Consultations: none  Safety Concerns:  none  Other:     Plan/Discussion: I took her vitals.  I reviewed CC, tobacco/med/surg Hx, meds effects/ side effects, problem list, therapies and responses as well as current situation/symptoms discussed  options. The patient will increase Lexapro to 30 mg daily and return in 2 months See orders and pt instructions for more details.  MEDICATIONS this encounter: Meds ordered this encounter  Medications  . escitalopram (LEXAPRO) 20 MG tablet    Sig: Take one and one half tablets daily    Dispense:  45 tablet    Refill:  2   Medical Decision Making Problem Points:  Established problem, stable/improving (1), Established problem, worsening (2), Review of last therapy session (1) and Review of psycho-social stressors (1) Data Points:  Review or order clinical lab tests (1) Review of medication regiment & side effects (2) Review of new medications or change in  dosage (2)  I certify that outpatient services furnished can reasonably be expected to improve the patient's condition.   Diannia RuderOSS, DEBORAH, MD

## 2014-10-13 ENCOUNTER — Ambulatory Visit (HOSPITAL_COMMUNITY): Payer: Self-pay | Admitting: Psychiatry

## 2014-10-24 ENCOUNTER — Encounter (HOSPITAL_COMMUNITY): Payer: Self-pay | Admitting: Psychiatry

## 2014-10-24 ENCOUNTER — Ambulatory Visit (INDEPENDENT_AMBULATORY_CARE_PROVIDER_SITE_OTHER): Payer: PRIVATE HEALTH INSURANCE | Admitting: Psychiatry

## 2014-10-24 VITALS — BP 142/77 | HR 82 | Ht 65.0 in | Wt 176.0 lb

## 2014-10-24 DIAGNOSIS — F410 Panic disorder [episodic paroxysmal anxiety] without agoraphobia: Secondary | ICD-10-CM

## 2014-10-24 DIAGNOSIS — F329 Major depressive disorder, single episode, unspecified: Secondary | ICD-10-CM

## 2014-10-24 DIAGNOSIS — F419 Anxiety disorder, unspecified: Principal | ICD-10-CM

## 2014-10-24 DIAGNOSIS — F411 Generalized anxiety disorder: Secondary | ICD-10-CM

## 2014-10-24 MED ORDER — ESCITALOPRAM OXALATE 20 MG PO TABS: ORAL_TABLET | ORAL | Status: DC

## 2014-10-24 NOTE — Progress Notes (Signed)
Patient ID: Whitney Valentine, female   DOB: 09/23/1965, 49 y.o.   MRN: [id number] Patient ID: Whitney NapoleonKimberly M Valentine, female   DOB: 09/23/1965, 49 y.o.   MRN: [id number] Patient ID: Whitney NapoleonKimberly M Valentine, female   DOB: 09/23/1965, 49 y.o.   MRN: [id number] Patient ID: Whitney NapoleonKimberly M Valentine, female   DOB: 09/23/1965, [age] y.o.   MRN: [id number] Patient ID: Whitney NapoleonKimberly M Valentine, female   DOB: 09/23/1965, 49 y.o.   MRN: [id number] Patient ID: Whitney NapoleonKimberly M Valentine, female   DOB: 09/23/1965, 49 y.o.   MRN: [id number] Patient ID: Whitney NapoleonKimberly M Valentine, female   DOB: 09/23/1965, 49 y.o.   MRN: [id number] [facility] [id number] Progress Note Whitney Valentine MRN: [id number] DOB: 09/23/1965 Age: 49 y.o.  Date: 10/24/2014 Start Time: 2:50 PM End Time: 3:21 PM  Chief Complaint: Chief Complaint  Patient presents with  . Depression  . Anxiety  . Follow-up   Subjective: This patient is a 49 year old married white female who lives with her husband and 51 year-old son in [place]. 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  The patient states that she has has significant anxiety and depression for the last 3 years. 3 years ago her mother and her brother both died of cancer and she had been her primary caregiver. Her father had died several years prior and now she is the only one left. She's finds that she cries easily and gets anxious and flustered. She still has panic attacks. She also is probably starting to go through menopause she's not had a period in several months. She will be a beginning with her gynecologist until December. She's having frequent hot flashes.  She returns after 3 months. She's a little bit more tremulous since we increased her Lexapro. Overall however she's feeling better and is crying much less and her mood has improved she also drinks about 3 sodas a day and I suggested she cut this back little bit. She's also going to try taking the Lexapro at bedtime :  Anxiety Presents for  initial visit. Onset was more than 5 years ago. The problem has been waxing and waning. Symptoms include chest pain, confusion, decreased concentration, depressed mood, dizziness, excessive worry, feeling of choking, hyperventilation, irritability, malaise, muscle tension, nausea, nervous/anxious behavior, palpitations, panic and shortness of breath. Patient reports no compulsions, dry mouth, insomnia, obsessions, restlessness or suicidal ideas. Symptoms occur constantly. The severity of symptoms is causing significant distress. The symptoms are aggravated by caffeine, family issues, social activities and work stress. The patient sleeps 13 hours per night. The quality of sleep is non-restorative. Nighttime awakenings: occasional.   Risk factors include family history, a major life event and marital problems. Her past medical history is significant for anxiety/panic attacks, arrhythmia and depression. There is no history of anemia, asthma, bipolar disorder, CAD, CHF, chronic lung disease, fibromyalgia, hyperthyroidism or suicide attempts. Past treatments include non-SSRI antidepressants and herbal remedies. The treatment provided mild relief. Compliance with prior treatments has been good.   Review of Systems  Constitutional: Positive for irritability, fatigue and unexpected weight change. Negative for fever, chills, diaphoresis, activity change and appetite change.  HENT: Positive for sneezing. Negative for congestion, dental problem, drooling, ear discharge, ear pain, facial swelling, hearing loss, mouth sores, nosebleeds, postnasal drip, rhinorrhea, sinus pressure, sore throat, tinnitus, trouble swallowing and voice change.   Eyes: Negative.   Respiratory: Positive for chest tightness and shortness of breath. Negative for apnea, cough, choking, wheezing and stridor.   Cardiovascular:  Positive for chest pain and palpitations. Negative for leg swelling.  Gastrointestinal: Positive for nausea. Negative for  vomiting, abdominal pain, diarrhea, constipation, blood in stool, abdominal distention, anal bleeding and rectal pain.  Genitourinary: Negative.   Musculoskeletal: Negative.  Negative for neck pain and neck stiffness.  Skin: Negative.   Neurological: Positive for dizziness and light-headedness. Negative for tremors, seizures, syncope, facial asymmetry, speech difficulty, weakness, numbness and headaches.  Psychiatric/Behavioral: Positive for confusion, sleep disturbance, dysphoric mood, decreased concentration and agitation. Negative for suicidal ideas, hallucinations, behavioral problems and self-injury. The patient is nervous/anxious. The patient does not have insomnia and is not hyperactive.   Skin: gets acne break outs when nervous.  Physical Exam  Depressive Symptoms: anhedonia, hypersomnia, psychomotor agitation, fatigue, difficulty concentrating, hopelessness, impaired memory, anxiety, panic attacks, weight gain, decreased labido, increased appetite,  (Hypo) Manic Symptoms:   Elevated Mood:  No Irritable Mood:  Yes Grandiosity:  No Distractibility:  Yes Labiality of Mood:  Yes Delusions:  No Hallucinations:  No Impulsivity:  No Sexually Inappropriate Behavior:  No Financial Extravagance:  No Flight of Ideas:  No  Anxiety Symptoms: Excessive Worry:  Yes Panic Symptoms:  Yes Agoraphobia:  No Obsessive Compulsive: No  Symptoms:  Specific Phobias:  No Social Anxiety:  No  Psychotic Symptoms:  Hallucinations: No  Delusions:  No Paranoia:  No   Ideas of Reference:  No  PTSD Symptoms: Ever had a traumatic exposure:  No Had a traumatic exposure in the last month:  No Re-experiencing: No  Hypervigilance:  No Hyperarousal: No  Avoidance: No   Traumatic Brain Injury: No   Past Psychiatric History: Diagnosis: Anxiety and Depression  Hospitalizations: none  Outpatient Care: PCP  Substance Abuse Care: none  Self-Mutilation: none  Suicidal Attempts: none   Violent Behaviors: none   Past Medical History:   Past Medical History  Diagnosis Date  . Anxiety   . Depression   . Iron deficiency anemia    History of Loss of Consciousness:  No Seizure History:  No Cardiac History:  No Allergies:   Allergies  Allergen Reactions  . Penicillins Other (See Comments)    Joint swelling    Current Medications:  Current Outpatient Prescriptions  Medication Sig Dispense Refill  . escitalopram (LEXAPRO) 20 MG tablet Take one and one half tablets daily 45 tablet 3  . NON FORMULARY Lolesterin 10 mg QD     No current facility-administered medications for this visit.    Previous Psychotropic Medications:  Medication Dose   Venlafaxin  37.5 mg BID   Substance Abuse History in the last 12 months: Substance Age of 1st Use Last Use Amount Specific Type  Nicotine  15  20      Alcohol  teens  teens      Cannabis  none        Opiates  none        Cocaine  none        Methamphetamines  none        LSD  none        Ecstasy  none         Benzodiazepines  none        Caffeine  childhood  1 hours ago  20 oz  Diet Coke  Inhalants  none        Others:       Sugar  childhood  today  handful  chocolate covered nuts  Medical Consequences of Substance Abuse: weight gain Legal Consequences  of Substance Abuse: none Family Consequences of Substance Abuse: none Blackouts:  No DT's:  No Withdrawal Symptoms:  No   Social History: Current Place of Residence: *[address] [place] [address] Place of Birth: [place], [place] Family Members: husband and son Marital Status:  Married Children: 1  Sons: 1  Daughters: 0 Relationships: husband and son Education:  HS Print production planner Problems/Performance: concentration, focus, retention Religious Beliefs/Practices: Advice worker History of Abuse: none Armed forces technical officer; Pensions consultant History:  None. Legal History: none Hobbies/Interests: reading walking and  music  Family History:   Family History  Problem Relation Age of Onset  . Anxiety disorder Mother   . Cancer Mother   . Depression Mother   . Alcohol abuse Father   . Anxiety disorder Father   . Cancer Father   . Depression Father   . Alcohol abuse Brother   . Anxiety disorder Brother   . Cancer Brother   . Dementia Maternal Grandmother   . ADD / ADHD Neg Hx   . Bipolar disorder Neg Hx   . Drug abuse Neg Hx   . OCD Neg Hx   . Paranoid behavior Neg Hx   . Schizophrenia Neg Hx   . Seizures Neg Hx   . Sexual abuse Neg Hx   . Physical abuse Neg Hx   . Anxiety disorder Son   . Exostosis Son     Mental Status Examination/Evaluation: Objective:  Appearance: Casual  Eye Contact::  Good  Speech:  Clear and Coherent  Volume:  Normal  Mood:good  Affect:  Congruent  Thought Process:  Coherent, Intact and Logical  Orientation:  Full (Time, Place, and Person)  Thought Content:  WDL  Suicidal Thoughts:  No  Homicidal Thoughts:  No  Judgement:  Good  Insight:  Good  Psychomotor Activity:  Normal  Akathisia:  No  Handed:  Left  AIMS (if indicated):    Assets:  Communication Skills Desire for Improvement   Assessment:    AXIS I Depressive Disorder NOS, Generalized Anxiety Disorder and Panic Disorder  AXIS II Deferred  AXIS III Past Medical History  Diagnosis Date  . Anxiety   . Depression   . Iron deficiency anemia      AXIS IV other psychosocial or environmental problems  AXIS V 51-60 moderate symptoms   Treatment Plan/Recommendations:  Laboratory:    Psychotherapy: CBT  Medications increase Lexapro to 30  mg every morning   Routine PRN Medications:  No  Consultations: none  Safety Concerns:  none  Other:     Plan/Discussion: I took her vitals.  I reviewed CC, tobacco/med/surg Hx, meds effects/ side effects, problem list, therapies and responses as well as current situation/symptoms discussed options. The patient will continue Lexapro to 30 mg daily and  return in 4 months See orders and pt instructions for more details.  MEDICATIONS this encounter: Meds ordered this encounter  Medications  . escitalopram (LEXAPRO) 20 MG tablet    Sig: Take one and one half tablets daily    Dispense:  45 tablet    Refill:  3   Medical Decision Making Problem Points:  Established problem, stable/improving (1), Established problem, worsening (2), Review of last therapy session (1) and Review of psycho-social stressors (1) Data Points:  Review or order clinical lab tests (1) Review of medication regiment & side effects (2) Review of new medications or change in dosage (2)  I certify that outpatient services furnished can reasonably be expected to improve  the patient's condition.   [name], MD

## 2014-11-22 ENCOUNTER — Other Ambulatory Visit (HOSPITAL_COMMUNITY): Payer: Self-pay | Admitting: Psychiatry

## 2015-01-20 ENCOUNTER — Other Ambulatory Visit (HOSPITAL_COMMUNITY): Payer: Self-pay | Admitting: Psychiatry

## 2015-02-20 ENCOUNTER — Ambulatory Visit (INDEPENDENT_AMBULATORY_CARE_PROVIDER_SITE_OTHER): Payer: PRIVATE HEALTH INSURANCE | Admitting: Psychiatry

## 2015-02-20 ENCOUNTER — Encounter (HOSPITAL_COMMUNITY): Payer: Self-pay | Admitting: Psychiatry

## 2015-02-20 VITALS — BP 150/84 | Ht 65.0 in | Wt 173.0 lb

## 2015-02-20 DIAGNOSIS — F329 Major depressive disorder, single episode, unspecified: Secondary | ICD-10-CM

## 2015-02-20 DIAGNOSIS — F419 Anxiety disorder, unspecified: Secondary | ICD-10-CM | POA: Diagnosis not present

## 2015-02-20 DIAGNOSIS — F32A Depression, unspecified: Secondary | ICD-10-CM

## 2015-02-20 DIAGNOSIS — F410 Panic disorder [episodic paroxysmal anxiety] without agoraphobia: Secondary | ICD-10-CM

## 2015-02-20 MED ORDER — ESCITALOPRAM OXALATE 20 MG PO TABS: ORAL_TABLET | ORAL | Status: DC

## 2015-02-20 NOTE — Progress Notes (Signed)
Patient ID: Whitney Valentine, female   DOB: 10/13/1965, 49 y.o.   MRN: [id number] Patient ID: Whitney Valentine, female   DOB: [birth year], 49 y.o.   MRN: [id number] Patient ID: Whitney Valentine, female   DOB: Oct 10, 1965, 49 y.o.   MRN: [id number] Patient ID: Whitney Valentine, female   DOB: 12-13-1965, 49 y.o.   MRN: [id number] Patient ID: Whitney Valentine, female   DOB: 05/11/1966, 49 y.o.   MRN: [id number] Patient ID: Whitney Valentine, female   DOB: [birth year], 49 y.o.   MRN: [id number] Patient ID: Whitney Valentine, female   DOB: 1965/12/03, 49 y.o.   MRN: [id number] Patient ID: Whitney Valentine, female   DOB: 05/24/1966, 49 y.o.   MRN: [id number] [facility] [id number] Progress Note Whitney Valentine MRN: [id number] DOB: 07-18-1966 Age: 49 y.o.  Date: 02/20/2015 Start Time: 2:50 PM End Time: 3:21 PM  Chief Complaint: Chief Complaint  Patient presents with  . Depression  . Anxiety  . Follow-up   Subjective: This patient is a 49 year-old married white female who lives with her husband and 6 year-old son in [place]. 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  The patient states that she has has significant anxiety and depression for the last 3 years. 3 years ago her mother and her brother both died of cancer and she had been her primary caregiver. Her father had died several years prior and now she is the only one left. She's finds that she cries easily and gets anxious and flustered. She still has panic attacks. She also is probably starting to go through menopause she's not had a period in several months. She will be a beginning with her gynecologist until December. She's having frequent hot flashes.  She returns after 4 months. For the most part she is doing well. Her mood is been stable on the Lexapro. She is probably still going through menopause but she's on a low dose estradiol replacement. Her husband's renal transplant is going well and she states that as  long as he is healthy she feels fine. :  Anxiety Presents for initial visit. Onset was more than 5 years ago. The problem has been waxing and waning. Symptoms include chest pain, confusion, decreased concentration, depressed mood, dizziness, excessive worry, feeling of choking, hyperventilation, irritability, malaise, muscle tension, nausea, nervous/anxious behavior, palpitations, panic and shortness of breath. Patient reports no compulsions, dry mouth, insomnia, obsessions, restlessness or suicidal ideas. Symptoms occur constantly. The severity of symptoms is causing significant distress. The symptoms are aggravated by caffeine, family issues, social activities and work stress. The patient sleeps 13 hours per night. The quality of sleep is non-restorative. Nighttime awakenings: occasional.   Risk factors include family history, a major life event and marital problems. Her past medical history is significant for anxiety/panic attacks, arrhythmia and depression. There is no history of anemia, asthma, bipolar disorder, CAD, CHF, chronic lung disease, fibromyalgia, hyperthyroidism or suicide attempts. Past treatments include non-SSRI antidepressants and herbal remedies. The treatment provided mild relief. Compliance with prior treatments has been good.   Review of Systems  Constitutional: Positive for irritability, fatigue and unexpected weight change. Negative for fever, chills, diaphoresis, activity change and appetite change.  HENT: Positive for sneezing. Negative for congestion, dental problem, drooling, ear discharge, ear pain, facial swelling, hearing loss, mouth sores, nosebleeds, postnasal drip, rhinorrhea, sinus pressure, sore throat, tinnitus, trouble swallowing and voice change.   Eyes: Negative.   Respiratory: Positive for chest tightness  and shortness of breath. Negative for apnea, cough, choking, wheezing and stridor.   Cardiovascular: Positive for chest pain and palpitations. Negative for  leg swelling.  Gastrointestinal: Positive for nausea. Negative for vomiting, abdominal pain, diarrhea, constipation, blood in stool, abdominal distention, anal bleeding and rectal pain.  Genitourinary: Negative.   Musculoskeletal: Negative.  Negative for neck pain and neck stiffness.  Skin: Negative.   Neurological: Positive for dizziness and light-headedness. Negative for tremors, seizures, syncope, facial asymmetry, speech difficulty, weakness, numbness and headaches.  Psychiatric/Behavioral: Positive for confusion, sleep disturbance, dysphoric mood, decreased concentration and agitation. Negative for suicidal ideas, hallucinations, behavioral problems and self-injury. The patient is nervous/anxious. The patient does not have insomnia and is not hyperactive.   Skin: gets acne break outs when nervous.  Physical Exam  Depressive Symptoms: anhedonia, hypersomnia, psychomotor agitation, fatigue, difficulty concentrating, hopelessness, impaired memory, anxiety, panic attacks, weight gain, decreased labido, increased appetite,  (Hypo) Manic Symptoms:   Elevated Mood:  No Irritable Mood:  Yes Grandiosity:  No Distractibility:  Yes Labiality of Mood:  Yes Delusions:  No Hallucinations:  No Impulsivity:  No Sexually Inappropriate Behavior:  No Financial Extravagance:  No Flight of Ideas:  No  Anxiety Symptoms: Excessive Worry:  Yes Panic Symptoms:  Yes Agoraphobia:  No Obsessive Compulsive: No  Symptoms:  Specific Phobias:  No Social Anxiety:  No  Psychotic Symptoms:  Hallucinations: No  Delusions:  No Paranoia:  No   Ideas of Reference:  No  PTSD Symptoms: Ever had a traumatic exposure:  No Had a traumatic exposure in the last month:  No Re-experiencing: No  Hypervigilance:  No Hyperarousal: No  Avoidance: No   Traumatic Brain Injury: No   Past Psychiatric History: Diagnosis: Anxiety and Depression  Hospitalizations: none  Outpatient Care: PCP  Substance  Abuse Care: none  Self-Mutilation: none  Suicidal Attempts: none  Violent Behaviors: none   Past Medical History:   Past Medical History  Diagnosis Date  . Anxiety   . Depression   . Iron deficiency anemia    History of Loss of Consciousness:  No Seizure History:  No Cardiac History:  No Allergies:   Allergies  Allergen Reactions  . Penicillins Other (See Comments)    Joint swelling    Current Medications:  Current Outpatient Prescriptions  Medication Sig Dispense Refill  . escitalopram (LEXAPRO) 20 MG tablet Take one and one half tablets daily 45 tablet 3  . NON FORMULARY Lolesterin 10 mg QD     No current facility-administered medications for this visit.    Previous Psychotropic Medications:  Medication Dose   Venlafaxin  37.5 mg BID   Substance Abuse History in the last 12 months: Substance Age of 1st Use Last Use Amount Specific Type  Nicotine  15  20      Alcohol  teens  teens      Cannabis  none        Opiates  none        Cocaine  none        Methamphetamines  none        LSD  none        Ecstasy  none         Benzodiazepines  none        Caffeine  childhood  1 hours ago  20 oz  Diet Coke  Inhalants  none        Others:       Sugar  childhood  today  handful  chocolate covered nuts  Medical Consequences of Substance Abuse: weight gain Legal Consequences of Substance Abuse: none Family Consequences of Substance Abuse: none Blackouts:  No DT's:  No Withdrawal Symptoms:  No   Social History: Current Place of Residence: *[address] [place] [address] Place of Birth: [place], [place] Family Members: husband and son Marital Status:  Married Children: 1  Sons: 1  Daughters: 0 Relationships: husband and son Education:  HS Print production planner Problems/Performance: concentration, focus, retention Religious Beliefs/Practices: Advice worker History of Abuse: none Armed forces technical officer; Pensions consultant History:  None. Legal  History: none Hobbies/Interests: reading walking and music  Family History:   Family History  Problem Relation Age of Onset  . Anxiety disorder Mother   . Cancer Mother   . Depression Mother   . Alcohol abuse Father   . Anxiety disorder Father   . Cancer Father   . Depression Father   . Alcohol abuse Brother   . Anxiety disorder Brother   . Cancer Brother   . Dementia Maternal Grandmother   . ADD / ADHD Neg Hx   . Bipolar disorder Neg Hx   . Drug abuse Neg Hx   . OCD Neg Hx   . Paranoid behavior Neg Hx   . Schizophrenia Neg Hx   . Seizures Neg Hx   . Sexual abuse Neg Hx   . Physical abuse Neg Hx   . Anxiety disorder Son   . Exostosis Son     Mental Status Examination/Evaluation: Objective:  Appearance: Casual  Eye Contact::  Good  Speech:  Clear and Coherent  Volume:  Normal  Mood:good  Affect:  Congruent  Thought Process:  Coherent, Intact and Logical  Orientation:  Full (Time, Place, and Person)  Thought Content:  WDL  Suicidal Thoughts:  No  Homicidal Thoughts:  No  Judgement:  Good  Insight:  Good  Psychomotor Activity:  Normal  Akathisia:  No  Handed:  Left  AIMS (if indicated):    Assets:  Communication Skills Desire for Improvement   Assessment:    AXIS I Depressive Disorder NOS, Generalized Anxiety Disorder and Panic Disorder  AXIS II Deferred  AXIS III Past Medical History  Diagnosis Date  . Anxiety   . Depression   . Iron deficiency anemia      AXIS IV other psychosocial or environmental problems  AXIS V 51-60 moderate symptoms   Treatment Plan/Recommendations:  Laboratory:    Psychotherapy: CBT  Medications increase Lexapro to 30  mg every morning   Routine PRN Medications:  No  Consultations: none  Safety Concerns:  none  Other:     Plan/Discussion: I took her vitals.  I reviewed CC, tobacco/med/surg Hx, meds effects/ side effects, problem list, therapies and responses as well as current situation/symptoms discussed options. The  patient will continue Lexapro to 30 mg daily for depression and anxiety and return in 4 months See orders and pt instructions for more details.  MEDICATIONS this encounter: Meds ordered this encounter  Medications  . escitalopram (LEXAPRO) 20 MG tablet    Sig: Take one and one half tablets daily    Dispense:  45 tablet    Refill:  3   Medical Decision Making Problem Points:  Established problem, stable/improving (1), Established problem, worsening (2), Review of last therapy session (1) and Review of psycho-social stressors (1) Data Points:  Review or order clinical lab tests (1) Review of medication regiment & side effects (2) Review of new  medications or change in dosage (2)  I certify that outpatient services furnished can reasonably be expected to improve the patient's condition.   [name], MD

## 2015-06-01 ENCOUNTER — Other Ambulatory Visit (HOSPITAL_COMMUNITY): Payer: Self-pay | Admitting: Psychiatry

## 2015-06-22 ENCOUNTER — Ambulatory Visit (HOSPITAL_COMMUNITY): Payer: Self-pay | Admitting: Psychiatry

## 2015-07-06 ENCOUNTER — Other Ambulatory Visit (HOSPITAL_COMMUNITY): Payer: Self-pay | Admitting: Psychiatry

## 2015-07-06 ENCOUNTER — Telehealth (HOSPITAL_COMMUNITY): Payer: Self-pay | Admitting: *Deleted

## 2015-07-06 MED ORDER — ESCITALOPRAM OXALATE 20 MG PO TABS: ORAL_TABLET | ORAL | Status: DC

## 2015-07-06 NOTE — Telephone Encounter (Signed)
sent 

## 2015-07-06 NOTE — Telephone Encounter (Signed)
Pt pharmacy requesting refills for her Lexapro. Pt medication was last filled 02-20-15 wit 45 tablets 3 refills. Pt f/u appt is 07-10-15. Pt pharmacy number is [phone]

## 2015-07-07 NOTE — Telephone Encounter (Signed)
Noted  

## 2015-07-10 ENCOUNTER — Ambulatory Visit (INDEPENDENT_AMBULATORY_CARE_PROVIDER_SITE_OTHER): Payer: PRIVATE HEALTH INSURANCE | Admitting: Psychiatry

## 2015-07-10 ENCOUNTER — Encounter (HOSPITAL_COMMUNITY): Payer: Self-pay | Admitting: Psychiatry

## 2015-07-10 VITALS — BP 126/81 | HR 93 | Ht 65.0 in | Wt 186.6 lb

## 2015-07-10 DIAGNOSIS — F419 Anxiety disorder, unspecified: Principal | ICD-10-CM

## 2015-07-10 DIAGNOSIS — F410 Panic disorder [episodic paroxysmal anxiety] without agoraphobia: Secondary | ICD-10-CM | POA: Diagnosis not present

## 2015-07-10 DIAGNOSIS — F411 Generalized anxiety disorder: Secondary | ICD-10-CM | POA: Diagnosis not present

## 2015-07-10 DIAGNOSIS — F329 Major depressive disorder, single episode, unspecified: Secondary | ICD-10-CM

## 2015-07-10 MED ORDER — ESCITALOPRAM OXALATE 20 MG PO TABS: 40.0000 mg | ORAL_TABLET | Freq: Every day | ORAL | Status: DC

## 2015-07-10 NOTE — Progress Notes (Signed)
Patient ID: Whitney Valentine, female   DOB: 09-Dec-1965, 49 y.o.   MRN: [id number] Patient ID: ALIVEA GLADSON, female   DOB: 1966-08-20, 49 y.o.   MRN: [id number] Patient ID: SENTA KANTOR, female   DOB: 03-21-1966, 49 y.o.   MRN: [id number] Patient ID: AERIEL BOULAY, female   DOB: 07-05-1966, 49 y.o.   MRN: [id number] Patient ID: LUNAH LOSASSO, female   DOB: 24-Oct-1965, 49 y.o.   MRN: [id number] Patient ID: KAYDE ATKERSON, female   DOB: 07/03/1966, 49 y.o.   MRN: [id number] Patient ID: SUA SPADAFORA, female   DOB: 29-Jan-1966, 49 y.o.   MRN: [id number] Patient ID: CIARA KAGAN, female   DOB: [birth year], 49 y.o.   MRN: [id number] Patient ID: SAMARA STANKOWSKI, female   DOB: 14-Nov-1965, 49 y.o.   MRN: [id number] [facility] [address] Progress Note JOENE GELDER MRN: [id number] DOB: 10/01/1965 Age: 49 y.o.  Date: 07/10/2015 Start Time: 2:50 PM End Time: 3:21 PM  Chief Complaint: Chief Complaint  Patient presents with  . Anxiety  . Depression  . Follow-up   Subjective: This patient is a 49 year-old married white female who lives with her husband and 67 year-old son in [place]. 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  The patient states that she has has significant anxiety and depression for the last 3 years. 3 years ago her mother and her brother both died of cancer and she had been her primary caregiver. Her father had died several years prior and now she is the only one left. She's finds that she cries easily and gets anxious and flustered. She still has panic attacks. She also is probably starting to go through menopause she's not had a period in several months. She will be a beginning with her gynecologist until December. She's having frequent hot flashes.  She returns after 6 months. She's been doing okay except her 5 year old son has started driving the school year. This is made her extremely nervous and anxious. She worries about  him all the time and keeps thinking something bad will happen. He hasn't had any accidents and she is witnessed him driving and knows he is very focused. She is doing well in other areas of her life but would like to increase the Lexapro and I suggested we go up to 40 mg per day :  Anxiety Presents for initial visit. Onset was more than 5 years ago. The problem has been waxing and waning. Symptoms include chest pain, confusion, decreased concentration, depressed mood, dizziness, excessive worry, feeling of choking, hyperventilation, irritability, malaise, muscle tension, nausea, nervous/anxious behavior, palpitations, panic and shortness of breath. Patient reports no compulsions, dry mouth, insomnia, obsessions, restlessness or suicidal ideas. Symptoms occur constantly. The severity of symptoms is causing significant distress. The symptoms are aggravated by caffeine, family issues, social activities and work stress. The patient sleeps 13 hours per night. The quality of sleep is non-restorative. Nighttime awakenings: occasional.   Risk factors include family history, a major life event and marital problems. Her past medical history is significant for anxiety/panic attacks, arrhythmia and depression. There is no history of anemia, asthma, bipolar disorder, CAD, CHF, chronic lung disease, fibromyalgia, hyperthyroidism or suicide attempts. Past treatments include non-SSRI antidepressants and herbal remedies. The treatment provided mild relief. Compliance with prior treatments has been good.  Depression      The patient presents with depression.  Associated symptoms include decreased concentration and fatigue.  Associated symptoms include does  not have insomnia, no restlessness, no appetite change, no headaches and no suicidal ideas.  Past medical history includes anxiety and depression.     Pertinent negatives include no suicide attempts.  Review of Systems  Constitutional: Positive for irritability, fatigue  and unexpected weight change. Negative for fever, chills, diaphoresis, activity change and appetite change.  HENT: Positive for sneezing. Negative for congestion, dental problem, drooling, ear discharge, ear pain, facial swelling, hearing loss, mouth sores, nosebleeds, postnasal drip, rhinorrhea, sinus pressure, sore throat, tinnitus, trouble swallowing and voice change.   Eyes: Negative.   Respiratory: Positive for chest tightness and shortness of breath. Negative for apnea, cough, choking, wheezing and stridor.   Cardiovascular: Positive for chest pain and palpitations. Negative for leg swelling.  Gastrointestinal: Positive for nausea. Negative for vomiting, abdominal pain, diarrhea, constipation, blood in stool, abdominal distention, anal bleeding and rectal pain.  Genitourinary: Negative.   Musculoskeletal: Negative.  Negative for neck pain and neck stiffness.  Skin: Negative.   Neurological: Positive for dizziness and light-headedness. Negative for tremors, seizures, syncope, facial asymmetry, speech difficulty, weakness, numbness and headaches.  Psychiatric/Behavioral: Positive for depression, confusion, sleep disturbance, dysphoric mood, decreased concentration and agitation. Negative for suicidal ideas, hallucinations, behavioral problems and self-injury. The patient is nervous/anxious. The patient does not have insomnia and is not hyperactive.   Skin: gets acne break outs when nervous.  Physical Exam  Depressive Symptoms: anhedonia, hypersomnia, psychomotor agitation, fatigue, difficulty concentrating, hopelessness, impaired memory, anxiety, panic attacks, weight gain, decreased labido, increased appetite,  (Hypo) Manic Symptoms:   Elevated Mood:  No Irritable Mood:  Yes Grandiosity:  No Distractibility:  Yes Labiality of Mood:  Yes Delusions:  No Hallucinations:  No Impulsivity:  No Sexually Inappropriate Behavior:  No Financial Extravagance:  No Flight of Ideas:   No  Anxiety Symptoms: Excessive Worry:  Yes Panic Symptoms:  Yes Agoraphobia:  No Obsessive Compulsive: No  Symptoms:  Specific Phobias:  No Social Anxiety:  No  Psychotic Symptoms:  Hallucinations: No  Delusions:  No Paranoia:  No   Ideas of Reference:  No  PTSD Symptoms: Ever had a traumatic exposure:  No Had a traumatic exposure in the last month:  No Re-experiencing: No  Hypervigilance:  No Hyperarousal: No  Avoidance: No   Traumatic Brain Injury: No   Past Psychiatric History: Diagnosis: Anxiety and Depression  Hospitalizations: none  Outpatient Care: PCP  Substance Abuse Care: none  Self-Mutilation: none  Suicidal Attempts: none  Violent Behaviors: none   Past Medical History:   Past Medical History  Diagnosis Date  . Anxiety   . Depression   . Iron deficiency anemia    History of Loss of Consciousness:  No Seizure History:  No Cardiac History:  No Allergies:   Allergies  Allergen Reactions  . Penicillins Other (See Comments)    Joint swelling    Current Medications:  Current Outpatient Prescriptions  Medication Sig Dispense Refill  . escitalopram (LEXAPRO) 20 MG tablet Take 2 tablets (40 mg total) by mouth daily. 60 tablet 3  . NON FORMULARY Lolesterin 10 mg QD     No current facility-administered medications for this visit.    Previous Psychotropic Medications:  Medication Dose   Venlafaxin  37.5 mg BID   Substance Abuse History in the last 12 months: Substance Age of 1st Use Last Use Amount Specific Type  Nicotine  15  20      Alcohol  teens  teens      Cannabis  none  Opiates  none        Cocaine  none        Methamphetamines  none        LSD  none        Ecstasy  none         Benzodiazepines  none        Caffeine  childhood  1 hours ago  20 oz  Diet Coke  Inhalants  none        Others:       Sugar  childhood  today  handful  chocolate covered nuts  Medical Consequences of Substance Abuse: weight gain Legal Consequences  of Substance Abuse: none Family Consequences of Substance Abuse: none Blackouts:  No DT's:  No Withdrawal Symptoms:  No   Social History: Current Place of Residence: *[address] [place] [address] Place of Birth: [place], [place] Family Members: husband and son Marital Status:  Married Children: 1  Sons: 1  Daughters: 0 Relationships: husband and son Education:  HS Print production planner Problems/Performance: concentration, focus, retention Religious Beliefs/Practices: Advice worker History of Abuse: none Armed forces technical officer; Pensions consultant History:  None. Legal History: none Hobbies/Interests: reading walking and music  Family History:   Family History  Problem Relation Age of Onset  . Anxiety disorder Mother   . Cancer Mother   . Depression Mother   . Alcohol abuse Father   . Anxiety disorder Father   . Cancer Father   . Depression Father   . Alcohol abuse Brother   . Anxiety disorder Brother   . Cancer Brother   . Dementia Maternal Grandmother   . ADD / ADHD Neg Hx   . Bipolar disorder Neg Hx   . Drug abuse Neg Hx   . OCD Neg Hx   . Paranoid behavior Neg Hx   . Schizophrenia Neg Hx   . Seizures Neg Hx   . Sexual abuse Neg Hx   . Physical abuse Neg Hx   . Anxiety disorder Son   . Exostosis Son     Mental Status Examination/Evaluation: Objective:  Appearance: Casual  Eye Contact::  Good  Speech:  Clear and Coherent  Volume:  Normal  Mood: Anxious   Affect: Congruent   Thought Process:  Coherent, Intact and Logical  Orientation:  Full (Time, Place, and Person)  Thought Content:  WDL  Suicidal Thoughts:  No  Homicidal Thoughts:  No  Judgement:  Good  Insight:  Good  Psychomotor Activity:  Normal  Akathisia:  No  Handed:  Left  AIMS (if indicated):    Assets:  Communication Skills Desire for Improvement   Assessment:    AXIS I Depressive Disorder NOS, Generalized Anxiety Disorder and Panic Disorder  AXIS II Deferred   AXIS III Past Medical History  Diagnosis Date  . Anxiety   . Depression   . Iron deficiency anemia      AXIS IV other psychosocial or environmental problems  AXIS V 51-60 moderate symptoms   Treatment Plan/Recommendations:  Laboratory:    Psychotherapy: CBT  Medications increase Lexapro to 30  mg every morning   Routine PRN Medications:  No  Consultations: none  Safety Concerns:  none  Other:     Plan/Discussion: I took her vitals.  I reviewed CC, tobacco/med/surg Hx, meds effects/ side effects, problem list, therapies and responses as well as current situation/symptoms discussed options. The patient will continue Lexapro but increase the dose to 40 mg daily for depression and  anxiety and return in 3 months See orders and pt instructions for more details.  MEDICATIONS this encounter: Meds ordered this encounter  Medications  . escitalopram (LEXAPRO) 20 MG tablet    Sig: Take 2 tablets (40 mg total) by mouth daily.    Dispense:  60 tablet    Refill:  3   Medical Decision Making Problem Points:  Established problem, stable/improving (1), Established problem, worsening (2), Review of last therapy session (1) and Review of psycho-social stressors (1) Data Points:  Review or order clinical lab tests (1) Review of medication regiment & side effects (2) Review of new medications or change in dosage (2)  I certify that outpatient services furnished can reasonably be expected to improve the patient's condition.   [name], MD

## 2015-09-28 ENCOUNTER — Other Ambulatory Visit (HOSPITAL_COMMUNITY): Payer: Self-pay | Admitting: Psychiatry

## 2015-10-08 ENCOUNTER — Encounter (HOSPITAL_COMMUNITY): Payer: Self-pay | Admitting: Psychiatry

## 2015-10-08 ENCOUNTER — Ambulatory Visit (INDEPENDENT_AMBULATORY_CARE_PROVIDER_SITE_OTHER): Payer: PRIVATE HEALTH INSURANCE | Admitting: Psychiatry

## 2015-10-08 VITALS — BP 136/83 | HR 76 | Ht 65.0 in | Wt 190.0 lb

## 2015-10-08 DIAGNOSIS — F411 Generalized anxiety disorder: Secondary | ICD-10-CM

## 2015-10-08 DIAGNOSIS — F419 Anxiety disorder, unspecified: Principal | ICD-10-CM

## 2015-10-08 DIAGNOSIS — F329 Major depressive disorder, single episode, unspecified: Secondary | ICD-10-CM

## 2015-10-08 DIAGNOSIS — F418 Other specified anxiety disorders: Secondary | ICD-10-CM

## 2015-10-08 MED ORDER — ESCITALOPRAM OXALATE 20 MG PO TABS: 40.0000 mg | ORAL_TABLET | Freq: Every day | ORAL | Status: DC

## 2015-10-08 NOTE — Progress Notes (Signed)
Patient ID: Whitney Valentine, female   DOB: 1966/04/07, 50 y.o.   MRN: [id number] Patient ID: Whitney Valentine, female   DOB: 08-10-1966, 50 y.o.   MRN: [id number] Patient ID: Whitney Valentine, female   DOB: 25-Feb-1966, 50 y.o.   MRN: [id number] Patient ID: Whitney Valentine, female   DOB: September 03, 1965, 50 y.o.   MRN: [id number] Patient ID: Whitney Valentine, female   DOB: 1966/04/10, 50 y.o.   MRN: [id number] Patient ID: Whitney Valentine, female   DOB: Nov 20, 1965, 49 y.o.   MRN: [id number] Patient ID: Whitney Valentine, female   DOB: [birth year], 50 y.o.   MRN: [id number] Patient ID: Whitney Valentine, female   DOB: 14-Feb-1966, 50 y.o.   MRN: [id number] Patient ID: Whitney Valentine, female   DOB: [birth year], 50 y.o.   MRN: [id number] Patient ID: Whitney Valentine, female   DOB: [birth year], 50 y.o.   MRN: [id number] [facility] [address] Progress Note Whitney Valentine MRN: [id number] DOB: 1965/09/22 Age: 50 y.o.  Date: 10/08/2015 Start Time: 2:50 PM End Time: 3:21 PM  Chief Complaint: Chief Complaint  Patient presents with  . Depression  . Anxiety  . Follow-up   Subjective: This patient is a 50 year-old married white female who lives with her husband and 79 year-old son in [place]. 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  The patient states that she has has significant anxiety and depression for the last 3 years. 3 years ago her mother and her brother both died of cancer and she had been her primary caregiver. Her father had died several years prior and now she is the only one left. She's finds that she cries easily and gets anxious and flustered. She still has panic attacks. She also is probably starting to go through menopause she's not had a period in several months. She will be a beginning with her gynecologist until December. She's having frequent hot flashes.  She returns after 3 months. For some reason the pharmacy gave her old prescription of 30 mg  Lexapro instead of 40 mg. She's not so worried about her son anymore but now she feels very stressed at work because it is so busy. She would like to try the higher dosage to see if it would help her relax. I also suggested getting more exercise and doing relaxing things such as listening to music and staying away from screen time at night.  :  Depression      The patient presents with depression.  Associated symptoms include decreased concentration and fatigue.  Associated symptoms include does not have insomnia, no restlessness, no appetite change, no headaches and no suicidal ideas.  Past medical history includes anxiety and depression.     Pertinent negatives include no suicide attempts. Anxiety Presents for initial visit. Onset was more than 5 years ago. The problem has been waxing and waning. Symptoms include chest pain, confusion, decreased concentration, depressed mood, dizziness, excessive worry, feeling of choking, hyperventilation, irritability, malaise, muscle tension, nausea, nervous/anxious behavior, palpitations, panic and shortness of breath. Patient reports no compulsions, dry mouth, insomnia, obsessions, restlessness or suicidal ideas. Symptoms occur constantly. The severity of symptoms is causing significant distress. The symptoms are aggravated by caffeine, family issues, social activities and work stress. The patient sleeps 13 hours per night. The quality of sleep is non-restorative. Nighttime awakenings: occasional.   Risk factors include family history, a major life event and marital problems. Her past medical history is significant for  anxiety/panic attacks, arrhythmia and depression. There is no history of anemia, asthma, bipolar disorder, CAD, CHF, chronic lung disease, fibromyalgia, hyperthyroidism or suicide attempts. Past treatments include non-SSRI antidepressants and herbal remedies. The treatment provided mild relief. Compliance with prior treatments has been good.   Review  of Systems  Constitutional: Positive for irritability, fatigue and unexpected weight change. Negative for fever, chills, diaphoresis, activity change and appetite change.  HENT: Positive for sneezing. Negative for congestion, dental problem, drooling, ear discharge, ear pain, facial swelling, hearing loss, mouth sores, nosebleeds, postnasal drip, rhinorrhea, sinus pressure, sore throat, tinnitus, trouble swallowing and voice change.   Eyes: Negative.   Respiratory: Positive for chest tightness and shortness of breath. Negative for apnea, cough, choking, wheezing and stridor.   Cardiovascular: Positive for chest pain and palpitations. Negative for leg swelling.  Gastrointestinal: Positive for nausea. Negative for vomiting, abdominal pain, diarrhea, constipation, blood in stool, abdominal distention, anal bleeding and rectal pain.  Genitourinary: Negative.   Musculoskeletal: Negative.  Negative for neck pain and neck stiffness.  Skin: Negative.   Neurological: Positive for dizziness and light-headedness. Negative for tremors, seizures, syncope, facial asymmetry, speech difficulty, weakness, numbness and headaches.  Psychiatric/Behavioral: Positive for depression, confusion, sleep disturbance, dysphoric mood, decreased concentration and agitation. Negative for suicidal ideas, hallucinations, behavioral problems and self-injury. The patient is nervous/anxious. The patient does not have insomnia and is not hyperactive.   Skin: gets acne break outs when nervous.  Physical Exam  Depressive Symptoms: anhedonia, hypersomnia, psychomotor agitation, fatigue, difficulty concentrating, hopelessness, impaired memory, anxiety, panic attacks, weight gain, decreased labido, increased appetite,  (Hypo) Manic Symptoms:   Elevated Mood:  No Irritable Mood:  Yes Grandiosity:  No Distractibility:  Yes Labiality of Mood:  Yes Delusions:  No Hallucinations:  No Impulsivity:  No Sexually Inappropriate  Behavior:  No Financial Extravagance:  No Flight of Ideas:  No  Anxiety Symptoms: Excessive Worry:  Yes Panic Symptoms:  Yes Agoraphobia:  No Obsessive Compulsive: No  Symptoms:  Specific Phobias:  No Social Anxiety:  No  Psychotic Symptoms:  Hallucinations: No  Delusions:  No Paranoia:  No   Ideas of Reference:  No  PTSD Symptoms: Ever had a traumatic exposure:  No Had a traumatic exposure in the last month:  No Re-experiencing: No  Hypervigilance:  No Hyperarousal: No  Avoidance: No   Traumatic Brain Injury: No   Past Psychiatric History: Diagnosis: Anxiety and Depression  Hospitalizations: none  Outpatient Care: PCP  Substance Abuse Care: none  Self-Mutilation: none  Suicidal Attempts: none  Violent Behaviors: none   Past Medical History:   Past Medical History  Diagnosis Date  . Anxiety   . Depression   . Iron deficiency anemia    History of Loss of Consciousness:  No Seizure History:  No Cardiac History:  No Allergies:   Allergies  Allergen Reactions  . Penicillins Other (See Comments)    Joint swelling    Current Medications:  Current Outpatient Prescriptions  Medication Sig Dispense Refill  . escitalopram (LEXAPRO) 20 MG tablet Take 2 tablets (40 mg total) by mouth daily. 60 tablet 3  . NON FORMULARY Lolesterin 10 mg QD     No current facility-administered medications for this visit.    Previous Psychotropic Medications:  Medication Dose   Venlafaxin  37.5 mg BID   Substance Abuse History in the last 12 months: Substance Age of 1st Use Last Use Amount Specific Type  Nicotine  15  20  Alcohol  teens  teens      Cannabis  none        Opiates  none        Cocaine  none        Methamphetamines  none        LSD  none        Ecstasy  none         Benzodiazepines  none        Caffeine  childhood  1 hours ago  20 oz  Diet Coke  Inhalants  none        Others:       Sugar  childhood  today  handful  chocolate covered nuts  Medical  Consequences of Substance Abuse: weight gain Legal Consequences of Substance Abuse: none Family Consequences of Substance Abuse: none Blackouts:  No DT's:  No Withdrawal Symptoms:  No   Social History: Current Place of Residence: *[address] [place] [address] Place of Birth: [place], [place] Family Members: husband and son Marital Status:  Married Children: 1  Sons: 1  Daughters: 0 Relationships: husband and son Education:  HS Print production planner Problems/Performance: concentration, focus, retention Religious Beliefs/Practices: Advice worker History of Abuse: none Armed forces technical officer; Pensions consultant History:  None. Legal History: none Hobbies/Interests: reading walking and music  Family History:   Family History  Problem Relation Age of Onset  . Anxiety disorder Mother   . Cancer Mother   . Depression Mother   . Alcohol abuse Father   . Anxiety disorder Father   . Cancer Father   . Depression Father   . Alcohol abuse Brother   . Anxiety disorder Brother   . Cancer Brother   . Dementia Maternal Grandmother   . ADD / ADHD Neg Hx   . Bipolar disorder Neg Hx   . Drug abuse Neg Hx   . OCD Neg Hx   . Paranoid behavior Neg Hx   . Schizophrenia Neg Hx   . Seizures Neg Hx   . Sexual abuse Neg Hx   . Physical abuse Neg Hx   . Anxiety disorder Son   . Exostosis Son     Mental Status Examination/Evaluation: Objective:  Appearance: Casual  Eye Contact::  Good  Speech:  Clear and Coherent  Volume:  Normal  Mood: Anxious   Affect: Congruent   Thought Process:  Coherent, Intact and Logical  Orientation:  Full (Time, Place, and Person)  Thought Content:  WDL  Suicidal Thoughts:  No  Homicidal Thoughts:  No  Judgement:  Good  Insight:  Good  Psychomotor Activity:  Normal  Akathisia:  No  Handed:  Left  AIMS (if indicated):    Assets:  Communication Skills Desire for Improvement   Assessment:    AXIS I Depressive Disorder NOS,  Generalized Anxiety Disorder and Panic Disorder  AXIS II Deferred  AXIS III Past Medical History  Diagnosis Date  . Anxiety   . Depression   . Iron deficiency anemia      AXIS IV other psychosocial or environmental problems  AXIS V 51-60 moderate symptoms   Treatment Plan/Recommendations:  Laboratory:    Psychotherapy: CBT  Medications increase Lexapro to 30  mg every morning   Routine PRN Medications:  No  Consultations: none  Safety Concerns:  none  Other:     Plan/Discussion: I took her vitals.  I reviewed CC, tobacco/med/surg Hx, meds effects/ side effects, problem list, therapies and responses as well as  current situation/symptoms discussed options. The patient will continue Lexapro but increase the dose to 40 mg daily for depression and anxiety and return in 3 months See orders and pt instructions for more details.  MEDICATIONS this encounter: Meds ordered this encounter  Medications  . escitalopram (LEXAPRO) 20 MG tablet    Sig: Take 2 tablets (40 mg total) by mouth daily.    Dispense:  60 tablet    Refill:  3   Medical Decision Making Problem Points:  Established problem, stable/improving (1), Established problem, worsening (2), Review of last therapy session (1) and Review of psycho-social stressors (1) Data Points:  Review or order clinical lab tests (1) Review of medication regiment & side effects (2) Review of new medications or change in dosage (2)  I certify that outpatient services furnished can reasonably be expected to improve the patient's condition.   [name], MD

## 2016-01-05 ENCOUNTER — Encounter (HOSPITAL_COMMUNITY): Payer: Self-pay | Admitting: Psychiatry

## 2016-01-05 ENCOUNTER — Ambulatory Visit (INDEPENDENT_AMBULATORY_CARE_PROVIDER_SITE_OTHER): Payer: PRIVATE HEALTH INSURANCE | Admitting: Psychiatry

## 2016-01-05 VITALS — BP 121/81 | HR 93 | Ht 65.0 in | Wt 171.8 lb

## 2016-01-05 DIAGNOSIS — F411 Generalized anxiety disorder: Secondary | ICD-10-CM

## 2016-01-05 DIAGNOSIS — F419 Anxiety disorder, unspecified: Principal | ICD-10-CM

## 2016-01-05 DIAGNOSIS — F329 Major depressive disorder, single episode, unspecified: Secondary | ICD-10-CM | POA: Diagnosis not present

## 2016-01-05 MED ORDER — ESCITALOPRAM OXALATE 20 MG PO TABS: 40.0000 mg | ORAL_TABLET | Freq: Every day | ORAL | Status: DC

## 2016-01-05 NOTE — Progress Notes (Signed)
Patient ID: Whitney Valentine, female   DOB: 05-May-1966, 50 y.o.   MRN: [id number] Patient ID: Whitney Valentine, female   DOB: April 28, 1966, 50 y.o.   MRN: [id number] Patient ID: Whitney Valentine, female   DOB: Jan 24, 1966, 50 y.o.   MRN: [id number] Patient ID: Whitney Valentine, female   DOB: December 20, 1965, 50 y.o.   MRN: [id number] Patient ID: Whitney Valentine, female   DOB: 10/08/1965, 50 y.o.   MRN: [id number] Patient ID: Whitney Valentine, female   DOB: 01-Aug-1966, 50 y.o.   MRN: [id number] Patient ID: Whitney Valentine, female   DOB: 1965-10-08, 50 y.o.   MRN: [id number] Patient ID: Whitney Valentine, female   DOB: 1966-02-11, 50 y.o.   MRN: [id number] Patient ID: Whitney Valentine, female   DOB: 1966-02-25, 50 y.o.   MRN: [id number] Patient ID: Whitney Valentine, female   DOB: 06/03/1966, 50 y.o.   MRN: [id number] Patient ID: Whitney Valentine, female   DOB: Jun 21, 1966, 50 y.o.   MRN: [id number] [facility] [id number] Progress Note Whitney Valentine MRN: [id number] DOB: 09-11-1965 Age: 50 y.o.  Date: 01/05/2016 Start Time: 2:50 PM End Time: 3:21 PM  Chief Complaint: Chief Complaint  Patient presents with  . Depression  . Anxiety  . Follow-up   Subjective: This patient is a 50 year-old married white female who lives with her husband and 87 year-old son in [place]. 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  The patient states that she has has significant anxiety and depression for the last 3 years. 3 years ago her mother and her brother both died of cancer and she had been her primary caregiver. Her father had died several years prior and now she is the only one left. She's finds that she cries easily and gets anxious and flustered. She still has panic attacks. She also is probably starting to go through menopause she's not had a period in several months. She will be a beginning with her gynecologist until December. She's having frequent hot flashes.  She returns  after 3 months. For the most part she is doing well. We increased her Celexa back to the 40 mg dosage. She is no longer anxious or having panic attacks and her depression is under good control. She does note that she still having hot flashes even though her menses stopped about 2 years ago. I think this is still probably menopausal symptom and I suggested she bring it up with her gynecologist  :  Depression      The patient presents with depression.  Associated symptoms include decreased concentration and fatigue.  Associated symptoms include does not have insomnia, no restlessness, no appetite change, no headaches and no suicidal ideas.  Past medical history includes anxiety and depression.     Pertinent negatives include no suicide attempts. Anxiety Presents for initial visit. Onset was more than 5 years ago. The problem has been waxing and waning. Symptoms include chest pain, confusion, decreased concentration, depressed mood, dizziness, excessive worry, feeling of choking, hyperventilation, irritability, malaise, muscle tension, nausea, nervous/anxious behavior, palpitations, panic and shortness of breath. Patient reports no compulsions, dry mouth, insomnia, obsessions, restlessness or suicidal ideas. Symptoms occur constantly. The severity of symptoms is causing significant distress. The symptoms are aggravated by caffeine, family issues, social activities and work stress. The patient sleeps 13 hours per night. The quality of sleep is non-restorative. Nighttime awakenings: occasional.   Risk factors include family history, a major life event and  marital problems. Her past medical history is significant for anxiety/panic attacks, arrhythmia and depression. There is no history of anemia, asthma, bipolar disorder, CAD, CHF, chronic lung disease, fibromyalgia, hyperthyroidism or suicide attempts. Past treatments include non-SSRI antidepressants and herbal remedies. The treatment provided mild relief.  Compliance with prior treatments has been good.   Review of Systems  Constitutional: Positive for irritability, fatigue and unexpected weight change. Negative for fever, chills, diaphoresis, activity change and appetite change.  HENT: Positive for sneezing. Negative for congestion, dental problem, drooling, ear discharge, ear pain, facial swelling, hearing loss, mouth sores, nosebleeds, postnasal drip, rhinorrhea, sinus pressure, sore throat, tinnitus, trouble swallowing and voice change.   Eyes: Negative.   Respiratory: Positive for chest tightness and shortness of breath. Negative for apnea, cough, choking, wheezing and stridor.   Cardiovascular: Positive for chest pain and palpitations. Negative for leg swelling.  Gastrointestinal: Positive for nausea. Negative for vomiting, abdominal pain, diarrhea, constipation, blood in stool, abdominal distention, anal bleeding and rectal pain.  Genitourinary: Negative.   Musculoskeletal: Negative.  Negative for neck pain and neck stiffness.  Skin: Negative.   Neurological: Positive for dizziness and light-headedness. Negative for tremors, seizures, syncope, facial asymmetry, speech difficulty, weakness, numbness and headaches.  Psychiatric/Behavioral: Positive for depression, confusion, sleep disturbance, dysphoric mood, decreased concentration and agitation. Negative for suicidal ideas, hallucinations, behavioral problems and self-injury. The patient is nervous/anxious. The patient does not have insomnia and is not hyperactive.   Skin: gets acne break outs when nervous.  Physical Exam  Depressive Symptoms: anhedonia, hypersomnia, psychomotor agitation, fatigue, difficulty concentrating, hopelessness, impaired memory, anxiety, panic attacks, weight gain, decreased labido, increased appetite,  (Hypo) Manic Symptoms:   Elevated Mood:  No Irritable Mood:  Yes Grandiosity:  No Distractibility:  Yes Labiality of Mood:  Yes Delusions:   No Hallucinations:  No Impulsivity:  No Sexually Inappropriate Behavior:  No Financial Extravagance:  No Flight of Ideas:  No  Anxiety Symptoms: Excessive Worry:  Yes Panic Symptoms:  Yes Agoraphobia:  No Obsessive Compulsive: No  Symptoms:  Specific Phobias:  No Social Anxiety:  No  Psychotic Symptoms:  Hallucinations: No  Delusions:  No Paranoia:  No   Ideas of Reference:  No  PTSD Symptoms: Ever had a traumatic exposure:  No Had a traumatic exposure in the last month:  No Re-experiencing: No  Hypervigilance:  No Hyperarousal: No  Avoidance: No   Traumatic Brain Injury: No   Past Psychiatric History: Diagnosis: Anxiety and Depression  Hospitalizations: none  Outpatient Care: PCP  Substance Abuse Care: none  Self-Mutilation: none  Suicidal Attempts: none  Violent Behaviors: none   Past Medical History:   Past Medical History  Diagnosis Date  . Anxiety   . Depression   . Iron deficiency anemia    History of Loss of Consciousness:  No Seizure History:  No Cardiac History:  No Allergies:   Allergies  Allergen Reactions  . Penicillins Other (See Comments)    Joint swelling    Current Medications:  Current Outpatient Prescriptions  Medication Sig Dispense Refill  . escitalopram (LEXAPRO) 20 MG tablet Take 2 tablets (40 mg total) by mouth daily. 60 tablet 3  . phentermine 30 MG capsule Take 30 mg by mouth every morning.     No current facility-administered medications for this visit.    Previous Psychotropic Medications:  Medication Dose   Venlafaxin  37.5 mg BID   Substance Abuse History in the last 12 months: Substance Age of 1st Use Last Use  Amount Specific Type  Nicotine  15  20      Alcohol  teens  teens      Cannabis  none        Opiates  none        Cocaine  none        Methamphetamines  none        LSD  none        Ecstasy  none         Benzodiazepines  none        Caffeine  childhood  1 hours ago  20 oz  Diet Coke  Inhalants  none         Others:       Sugar  childhood  today  handful  chocolate covered nuts  Medical Consequences of Substance Abuse: weight gain Legal Consequences of Substance Abuse: none Family Consequences of Substance Abuse: none Blackouts:  No DT's:  No Withdrawal Symptoms:  No   Social History: Current Place of Residence: *[address] [place] [address] Place of Birth: [place], [place] Family Members: husband and son Marital Status:  Married Children: 1  Sons: 1  Daughters: 0 Relationships: husband and son Education:  HS Print production plannerGraduate Educational Problems/Performance: concentration, focus, retention Religious Beliefs/Practices: Advice workerentecostal Holiness History of Abuse: none Armed forces technical officerccupational Experiences; Pensions consultantsecretarial and support Military History:  None. Legal History: none Hobbies/Interests: reading walking and music  Family History:   Family History  Problem Relation Age of Onset  . Anxiety disorder Mother   . Cancer Mother   . Depression Mother   . Alcohol abuse Father   . Anxiety disorder Father   . Cancer Father   . Depression Father   . Alcohol abuse Brother   . Anxiety disorder Brother   . Cancer Brother   . Dementia Maternal Grandmother   . ADD / ADHD Neg Hx   . Bipolar disorder Neg Hx   . Drug abuse Neg Hx   . OCD Neg Hx   . Paranoid behavior Neg Hx   . Schizophrenia Neg Hx   . Seizures Neg Hx   . Sexual abuse Neg Hx   . Physical abuse Neg Hx   . Anxiety disorder Son   . Exostosis Son     Mental Status Examination/Evaluation: Objective:  Appearance: Casual  Eye Contact::  Good  Speech:  Clear and Coherent  Volume:  Normal  Mood: Good   Affect: Bright   Thought Process:  Coherent, Intact and Logical  Orientation:  Full (Time, Place, and Person)  Thought Content:  WDL  Suicidal Thoughts:  No  Homicidal Thoughts:  No  Judgement:  Good  Insight:  Good  Psychomotor Activity:  Normal  Akathisia:  No  Handed:  Left  AIMS (if indicated):    Assets:  Communication  Skills Desire for Improvement   Assessment:    AXIS I Depressive Disorder NOS, Generalized Anxiety Disorder and Panic Disorder  AXIS II Deferred  AXIS III Past Medical History  Diagnosis Date  . Anxiety   . Depression   . Iron deficiency anemia      AXIS IV other psychosocial or environmental problems  AXIS V 51-60 moderate symptoms   Treatment Plan/Recommendations:  Laboratory:    Psychotherapy: CBT  Medications increase Lexapro to 30  mg every morning   Routine PRN Medications:  No  Consultations: none  Safety Concerns:  none  Other:     Plan/Discussion: I took her vitals.  I reviewed CC,  tobacco/med/surg Hx, meds effects/ side effects, problem list, therapies and responses as well as current situation/symptoms discussed options. The patient will continue Lexapro 40 mg daily for depression and anxiety and return in 4 months See orders and pt instructions for more details.  MEDICATIONS this encounter: Meds ordered this encounter  Medications  . phentermine 30 MG capsule    Sig: Take 30 mg by mouth every morning.  . escitalopram (LEXAPRO) 20 MG tablet    Sig: Take 2 tablets (40 mg total) by mouth daily.    Dispense:  60 tablet    Refill:  3   Medical Decision Making Problem Points:  Established problem, stable/improving (1), Established problem, worsening (2), Review of last therapy session (1) and Review of psycho-social stressors (1) Data Points:  Review or order clinical lab tests (1) Review of medication regiment & side effects (2) Review of new medications or change in dosage (2)  I certify that outpatient services furnished can reasonably be expected to improve the patient's condition.   [name], MD

## 2016-01-27 ENCOUNTER — Other Ambulatory Visit (HOSPITAL_COMMUNITY): Payer: Self-pay | Admitting: Psychiatry

## 2016-05-05 ENCOUNTER — Ambulatory Visit (HOSPITAL_COMMUNITY): Payer: Self-pay | Admitting: Psychiatry

## 2016-05-31 ENCOUNTER — Encounter (HOSPITAL_COMMUNITY): Payer: Self-pay | Admitting: Psychiatry

## 2016-05-31 ENCOUNTER — Ambulatory Visit (INDEPENDENT_AMBULATORY_CARE_PROVIDER_SITE_OTHER): Payer: PRIVATE HEALTH INSURANCE | Admitting: Psychiatry

## 2016-05-31 VITALS — BP 133/85 | HR 80 | Ht 65.0 in | Wt 175.0 lb

## 2016-05-31 DIAGNOSIS — F329 Major depressive disorder, single episode, unspecified: Secondary | ICD-10-CM

## 2016-05-31 DIAGNOSIS — F418 Other specified anxiety disorders: Secondary | ICD-10-CM | POA: Diagnosis not present

## 2016-05-31 DIAGNOSIS — F419 Anxiety disorder, unspecified: Principal | ICD-10-CM

## 2016-05-31 MED ORDER — CLONAZEPAM 0.5 MG PO TABS: 0.5000 mg | ORAL_TABLET | Freq: Two times a day (BID) | ORAL | 1 refills | Status: DC

## 2016-05-31 MED ORDER — ESCITALOPRAM OXALATE 20 MG PO TABS: 40.0000 mg | ORAL_TABLET | Freq: Every day | ORAL | 3 refills | Status: DC
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
## 2016-06-27 ENCOUNTER — Ambulatory Visit (HOSPITAL_COMMUNITY): Payer: Self-pay | Admitting: Psychiatry

## 2016-11-29 ENCOUNTER — Encounter (HOSPITAL_COMMUNITY): Payer: Self-pay | Admitting: Psychiatry

## 2016-11-29 ENCOUNTER — Ambulatory Visit (INDEPENDENT_AMBULATORY_CARE_PROVIDER_SITE_OTHER): Payer: 59 | Admitting: Psychiatry

## 2016-11-29 VITALS — BP 128/69 | HR 98 | Ht 65.0 in | Wt 190.0 lb

## 2016-11-29 DIAGNOSIS — Z79899 Other long term (current) drug therapy: Secondary | ICD-10-CM

## 2016-11-29 DIAGNOSIS — Z811 Family history of alcohol abuse and dependence: Secondary | ICD-10-CM

## 2016-11-29 DIAGNOSIS — F411 Generalized anxiety disorder: Secondary | ICD-10-CM | POA: Diagnosis not present

## 2016-11-29 DIAGNOSIS — F329 Major depressive disorder, single episode, unspecified: Secondary | ICD-10-CM

## 2016-11-29 DIAGNOSIS — F419 Anxiety disorder, unspecified: Principal | ICD-10-CM

## 2016-11-29 DIAGNOSIS — F410 Panic disorder [episodic paroxysmal anxiety] without agoraphobia: Secondary | ICD-10-CM | POA: Diagnosis not present

## 2016-11-29 DIAGNOSIS — Z810 Family history of intellectual disabilities: Secondary | ICD-10-CM

## 2016-11-29 DIAGNOSIS — Z880 Allergy status to penicillin: Secondary | ICD-10-CM

## 2016-11-29 DIAGNOSIS — Z818 Family history of other mental and behavioral disorders: Secondary | ICD-10-CM

## 2016-11-29 MED ORDER — ESCITALOPRAM OXALATE 20 MG PO TABS: 40.0000 mg | ORAL_TABLET | Freq: Every day | ORAL | 3 refills | Status: DC

## 2016-11-29 MED ORDER — HYDROXYZINE PAMOATE 25 MG PO CAPS: 25.0000 mg | ORAL_CAPSULE | Freq: Three times a day (TID) | ORAL | 2 refills | Status: DC | PRN

## 2016-11-29 NOTE — Progress Notes (Signed)
Patient ID: Whitney Valentine, female   DOB: [birth year], 51 y.o.   MRN: [id number] Patient ID: Whitney Valentine, female   DOB: [birth year], 51 y.o.   MRN: [id number] Patient ID: Whitney Valentine, female   DOB: 1966-08-02, 51 y.o.   MRN: [id number] Patient ID: Whitney Valentine, female   DOB: 02/16/1966, 51 y.o.   MRN: [id number] Patient ID: Whitney Valentine, female   DOB: 1965/09/27, 51 y.o.   MRN: [id number] Patient ID: Whitney Valentine, female   DOB: 16-Mar-1966, 51 y.o.   MRN: [id number] Patient ID: Whitney Valentine, female   DOB: February 17, 1966, 51 y.o.   MRN: [id number] Patient ID: Whitney Valentine, female   DOB: 1966-08-25, 51 y.o.   MRN: [id number] Patient ID: Whitney Valentine, female   DOB: Dec 15, 1965, 51 y.o.   MRN: [id number] Patient ID: Whitney Valentine, female   DOB: [birth year], 51 y.o.   MRN: [id number] Patient ID: Whitney Valentine, female   DOB: 12-19-1965, 51 y.o.   MRN: [id number] [facility] [address] Progress Note [name] MRN: [id number] DOB: December 03, 1965 Age: 51 y.o.  Date: 11/29/2016 Start Time: 2:50 PM End Time: 3:21 PM  Chief Complaint: Chief Complaint  Patient presents with  . Depression  . Anxiety  . Follow-up   Subjective: This patient is a 51 year-old married white female who lives with her husband and 46 year-old son in [place]. 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  The patient states that she has has significant anxiety and depression for the last 3 years. 3 years ago her mother and her brother both died of cancer and she had been her primary caregiver. Her father had died several years prior and now she is the only one left. She's finds that she cries easily and gets anxious and flustered. She still has panic attacks. She also is probably starting to go through menopause she's not had a period in several months. She will be a beginning with her gynecologist until December. She's having frequent hot flashes.  She returns  after 6 months. She has missed some appointments. She's had a lot going on-her mother-in-law died and then her husband had an aneurysm in his leg and needed emergency surgery in February. She did never refills for Lexapro and now she is feeling depressed and anxious and crying a lot. She starting to have difficulty with panic and is having trouble sleeping. She would like to go back on the Lexapro and her primary doctor has given her Vistaril which has helped a little bit with the anxiety. She is also having an assessment for sleep apnea :  Anxiety  Presents for initial visit. Onset was more than 5 years ago. The problem has been waxing and waning. Symptoms include chest pain, confusion, decreased concentration, depressed mood, dizziness, excessive worry, feeling of choking, hyperventilation, irritability, malaise, muscle tension, nausea, nervous/anxious behavior, palpitations, panic and shortness of breath. Patient reports no compulsions, dry mouth, insomnia, obsessions, restlessness or suicidal ideas. Symptoms occur constantly. The severity of symptoms is causing significant distress. The symptoms are aggravated by caffeine, family issues, social activities and work stress. The patient sleeps 13 hours per night. The quality of sleep is non-restorative. Nighttime awakenings: occasional.   Risk factors include family history, a major life event and marital problems. Her past medical history is significant for anxiety/panic attacks, arrhythmia and depression. There is no history of anemia, asthma, bipolar disorder, CAD, CHF, chronic lung disease, fibromyalgia, hyperthyroidism or suicide  attempts. Past treatments include non-SSRI antidepressants and herbal remedies. The treatment provided mild relief. Compliance with prior treatments has been good.  Depression       The patient presents with depression.  Associated symptoms include decreased concentration and fatigue.  Associated symptoms include does not have  insomnia, no restlessness, no appetite change, no headaches and no suicidal ideas.  Past medical history includes anxiety and depression.     Pertinent negatives include no suicide attempts.  Review of Systems  Constitutional: Positive for fatigue, irritability and unexpected weight change. Negative for activity change, appetite change, chills, diaphoresis and fever.  HENT: Positive for sneezing. Negative for congestion, dental problem, drooling, ear discharge, ear pain, facial swelling, hearing loss, mouth sores, nosebleeds, postnasal drip, rhinorrhea, sinus pressure, sore throat, tinnitus, trouble swallowing and voice change.   Eyes: Negative.   Respiratory: Positive for chest tightness and shortness of breath. Negative for apnea, cough, choking, wheezing and stridor.   Cardiovascular: Positive for chest pain and palpitations. Negative for leg swelling.  Gastrointestinal: Positive for nausea. Negative for abdominal distention, abdominal pain, anal bleeding, blood in stool, constipation, diarrhea, rectal pain and vomiting.  Genitourinary: Negative.   Musculoskeletal: Negative.  Negative for neck pain and neck stiffness.  Skin: Negative.   Neurological: Positive for dizziness and light-headedness. Negative for tremors, seizures, syncope, facial asymmetry, speech difficulty, weakness, numbness and headaches.  Psychiatric/Behavioral: Positive for agitation, confusion, decreased concentration, depression, dysphoric mood and sleep disturbance. Negative for behavioral problems, hallucinations, self-injury and suicidal ideas. The patient is nervous/anxious. The patient does not have insomnia and is not hyperactive.   Skin: gets acne break outs when nervous.  Physical Exam  Depressive Symptoms: anhedonia, hypersomnia, psychomotor agitation, fatigue, difficulty concentrating, hopelessness, impaired memory, anxiety, panic attacks, weight gain, decreased labido, increased appetite,  (Hypo) Manic  Symptoms:   Elevated Mood:  No Irritable Mood:  Yes Grandiosity:  No Distractibility:  Yes Labiality of Mood:  Yes Delusions:  No Hallucinations:  No Impulsivity:  No Sexually Inappropriate Behavior:  No Financial Extravagance:  No Flight of Ideas:  No  Anxiety Symptoms: Excessive Worry:  Yes Panic Symptoms:  Yes Agoraphobia:  No Obsessive Compulsive: No  Symptoms:  Specific Phobias:  No Social Anxiety:  No  Psychotic Symptoms:  Hallucinations: No  Delusions:  No Paranoia:  No   Ideas of Reference:  No  PTSD Symptoms: Ever had a traumatic exposure:  No Had a traumatic exposure in the last month:  No Re-experiencing: No  Hypervigilance:  No Hyperarousal: No  Avoidance: No   Traumatic Brain Injury: No   Past Psychiatric History: Diagnosis: Anxiety and Depression  Hospitalizations: none  Outpatient Care: PCP  Substance Abuse Care: none  Self-Mutilation: none  Suicidal Attempts: none  Violent Behaviors: none   Past Medical History:   Past Medical History:  Diagnosis Date  . Anxiety   . Depression   . Iron deficiency anemia    History of Loss of Consciousness:  No Seizure History:  No Cardiac History:  No Allergies:   Allergies  Allergen Reactions  . Penicillins Other (See Comments)    Joint swelling    Current Medications:  Current Outpatient Prescriptions  Medication Sig Dispense Refill  . hydrOXYzine (VISTARIL) 25 MG capsule Take 1 capsule (25 mg total) by mouth 3 (three) times daily as needed. 90 capsule 2  . Vitamin D, Ergocalciferol, (DRISDOL) 50000 units CAPS capsule Take 50,000 Units by mouth every 7 (seven) days.    Marland Kitchen escitalopram (LEXAPRO) 20 MG  tablet Take 2 tablets (40 mg total) by mouth daily. 60 tablet 3   No current facility-administered medications for this visit.     Previous Psychotropic Medications:  Medication Dose   Venlafaxin  37.5 mg BID   Substance Abuse History in the last 12 months: Substance Age of 1st Use Last Use  Amount Specific Type  Nicotine  15  20      Alcohol  teens  teens      Cannabis  none        Opiates  none        Cocaine  none        Methamphetamines  none        LSD  none        Ecstasy  none         Benzodiazepines  none        Caffeine  childhood  1 hours ago  20 oz  Diet Coke  Inhalants  none        Others:       Sugar  childhood  today  handful  chocolate covered nuts  Medical Consequences of Substance Abuse: weight gain Legal Consequences of Substance Abuse: none Family Consequences of Substance Abuse: none Blackouts:  No DT's:  No Withdrawal Symptoms:  No   Social History: Current Place of Residence: *[address] [place] [address] Place of Birth: [place], [place] Family Members: husband and son Marital Status:  Married Children: 1  Sons: 1  Daughters: 0 Relationships: husband and son Education:  HS Print production planner Problems/Performance: concentration, focus, retention Religious Beliefs/Practices: Advice worker History of Abuse: none Armed forces technical officer; Pensions consultant History:  None. Legal History: none Hobbies/Interests: reading walking and music  Family History:   Family History  Problem Relation Age of Onset  . Anxiety disorder Mother   . Cancer Mother   . Depression Mother   . Alcohol abuse Father   . Anxiety disorder Father   . Cancer Father   . Depression Father   . Alcohol abuse Brother   . Anxiety disorder Brother   . Cancer Brother   . Dementia Maternal Grandmother   . ADD / ADHD Neg Hx   . Bipolar disorder Neg Hx   . Drug abuse Neg Hx   . OCD Neg Hx   . Paranoid behavior Neg Hx   . Schizophrenia Neg Hx   . Seizures Neg Hx   . Sexual abuse Neg Hx   . Physical abuse Neg Hx   . Anxiety disorder Son   . Exostosis Son     Mental Status Examination/Evaluation: Objective:  Appearance: Casual nicely dressed and groomed   Eye Contact::  Good  Speech:  Clear and Coherent  Volume:  Normal  Mood: Anxious    Affect: Congruent   Thought Process:  Coherent, Intact and Logical  Orientation:  Full (Time, Place, and Person)  Thought Content:  WDL  Suicidal Thoughts:  No  Homicidal Thoughts:  No  Judgement:  Good  Insight:  Good  Psychomotor Activity:  Normal  Akathisia:  No  Handed:  Left  AIMS (if indicated):    Assets:  Communication Skills Desire for Improvement   Assessment:    AXIS I Depressive Disorder NOS, Generalized Anxiety Disorder and Panic Disorder  AXIS II Deferred  AXIS III Past Medical History:  Diagnosis Date  . Anxiety   . Depression   . Iron deficiency anemia      AXIS IV other  psychosocial or environmental problems  AXIS V 51-60 moderate symptoms   Treatment Plan/Recommendations:  Laboratory:    Psychotherapy: CBT  Medications increase Lexapro to 30  mg every morning   Routine PRN Medications:  No  Consultations: none  Safety Concerns:  none  Other:     Plan/Discussion: I took her vitals.  I reviewed CC, tobacco/med/surg Hx, meds effects/ side effects, problem list, therapies and responses as well as current situation/symptoms discussed options. The patient will Restart Lexapro 20 mg daily for 2 weeks then advance to 40 mg daily for depression and anxieyt. He can continue Vistaril 25 mg 3 times a day as needed for anxiety. She'll return in 6 weeks See orders and pt instructions for more details.  MEDICATIONS this encounter: Meds ordered this encounter  Medications  . DISCONTD: hydrOXYzine (VISTARIL) 25 MG capsule    Sig: Take 25 mg by mouth 3 (three) times daily as needed.  . Vitamin D, Ergocalciferol, (DRISDOL) 50000 units CAPS capsule    Sig: Take 50,000 Units by mouth every 7 (seven) days.  Marland Kitchen escitalopram (LEXAPRO) 20 MG tablet    Sig: Take 2 tablets (40 mg total) by mouth daily.    Dispense:  60 tablet    Refill:  3  . hydrOXYzine (VISTARIL) 25 MG capsule    Sig: Take 1 capsule (25 mg total) by mouth 3 (three) times daily as needed.     Dispense:  90 capsule    Refill:  2   Medical Decision Making Problem Points:  Established problem, stable/improving (1), Established problem, worsening (2), Review of last therapy session (1) and Review of psycho-social stressors (1) Data Points:  Review or order clinical lab tests (1) Review of medication regiment & side effects (2) Review of new medications or change in dosage (2)  I certify that outpatient services furnished can reasonably be expected to improve the patient's condition.   [name], MD    Patient ID: LOURDEZ MCGAHAN, female   DOB: [birth year], 51 y.o.   MRN: [id number]

## 2016-11-30 ENCOUNTER — Ambulatory Visit: Payer: Self-pay | Admitting: Cardiology

## 2017-01-10 ENCOUNTER — Telehealth (HOSPITAL_COMMUNITY): Payer: Self-pay | Admitting: *Deleted

## 2017-01-10 NOTE — Telephone Encounter (Signed)
left voice message, provider out of office 01/16/17. 

## 2017-01-12 ENCOUNTER — Encounter (HOSPITAL_COMMUNITY): Payer: Self-pay | Admitting: Psychiatry

## 2017-01-12 ENCOUNTER — Ambulatory Visit (INDEPENDENT_AMBULATORY_CARE_PROVIDER_SITE_OTHER): Payer: 59 | Admitting: Psychiatry

## 2017-01-12 VITALS — BP 132/83 | HR 96 | Ht 65.0 in | Wt 194.0 lb

## 2017-01-12 DIAGNOSIS — F410 Panic disorder [episodic paroxysmal anxiety] without agoraphobia: Secondary | ICD-10-CM | POA: Diagnosis not present

## 2017-01-12 DIAGNOSIS — F419 Anxiety disorder, unspecified: Secondary | ICD-10-CM | POA: Diagnosis not present

## 2017-01-12 DIAGNOSIS — Z818 Family history of other mental and behavioral disorders: Secondary | ICD-10-CM | POA: Diagnosis not present

## 2017-01-12 DIAGNOSIS — Z810 Family history of intellectual disabilities: Secondary | ICD-10-CM

## 2017-01-12 DIAGNOSIS — Z811 Family history of alcohol abuse and dependence: Secondary | ICD-10-CM

## 2017-01-12 DIAGNOSIS — F329 Major depressive disorder, single episode, unspecified: Secondary | ICD-10-CM

## 2017-01-12 NOTE — Progress Notes (Signed)
Patient ID: Whitney Valentine, female   DOB: [birth year], 51 y.o.   MRN: [id number] Patient ID: Whitney Valentine, female   DOB: [birth year], 51 y.o.   MRN: [id number] Patient ID: Whitney Valentine, female   DOB: 10-11-1965, 51 y.o.   MRN: [id number] Patient ID: Whitney Valentine, female   DOB: 1966-05-21, 51 y.o.   MRN: [id number] Patient ID: Whitney Valentine, female   DOB: [birth year], 51 y.o.   MRN: [id number] Patient ID: Whitney Valentine, female   DOB: 21-Mar-1966, 51 y.o.   MRN: [id number] Patient ID: Whitney Valentine, female   DOB: 1965/10/12, 51 y.o.   MRN: [id number] Patient ID: Whitney Valentine, female   DOB: Feb 28, 1966, 51 y.o.   MRN: [id number] Patient ID: Whitney Valentine, female   DOB: [birth year], 51 y.o.   MRN: [id number] Patient ID: Whitney Valentine, female   DOB: 06-18-1966, 51 y.o.   MRN: [id number] Patient ID: Whitney Valentine, female   DOB: [birth year], 51 y.o.   MRN: [id number] [facility] [address] Progress Note Whitney Valentine MRN: [id number] DOB: [birth year] Age: 51 y.o.  Date: 01/12/2017 Start Time: 2:50 PM End Time: 3:21 PM  Chief Complaint: Chief Complaint  Patient presents with  . Anxiety  . Depression   Subjective: This patient is a 51 year-old married white female who lives with her husband and 73 year-old son in [place]. 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  The patient states that she has has significant anxiety and depression for the last 3 years. 3 years ago her mother and her brother both died of cancer and she had been her primary caregiver. Her father had died several years prior and now she is the only one left. She's finds that she cries easily and gets anxious and flustered. She still has panic attacks. She also is probably starting to go through menopause she's not had a period in several months. She will be a beginning with her gynecologist until December. She's having frequent hot flashes.  She returns after 4 weeks.  She is now on the Lexapro and Vistaril. She is feeling better less anxious sleeping well. She denies being depressed. She is often tired after work he doesn't feel like exercising. I strongly suggested an assessment for sleep apnea as she tends to snore and feels like she is fighting fatigue :  Depression       The patient presents with depression.  Associated symptoms include decreased concentration and fatigue.  Associated symptoms include does not have insomnia, no restlessness, no appetite change, no headaches and no suicidal ideas.  Past medical history includes anxiety and depression.     Pertinent negatives include no suicide attempts. Anxiety  Presents for initial visit. Onset was more than 5 years ago. The problem has been waxing and waning. Symptoms include chest pain, confusion, decreased concentration, depressed mood, dizziness, excessive worry, feeling of choking, hyperventilation, irritability, malaise, muscle tension, nausea, nervous/anxious behavior, palpitations, panic and shortness of breath. Patient reports no compulsions, dry mouth, insomnia, obsessions, restlessness or suicidal ideas. Symptoms occur constantly. The severity of symptoms is causing significant distress. The symptoms are aggravated by caffeine, family issues, social activities and work stress. The patient sleeps 13 hours per night. The quality of sleep is non-restorative. Nighttime awakenings: occasional.   Risk factors include family history, a major life event and marital problems. Her past medical history is significant for anxiety/panic attacks, arrhythmia and depression. There is no history of anemia, asthma,  bipolar disorder, CAD, CHF, chronic lung disease, fibromyalgia, hyperthyroidism or suicide attempts. Past treatments include non-SSRI antidepressants and herbal remedies. The treatment provided mild relief. Compliance with prior treatments has been good.   Review of Systems  Constitutional: Positive for  fatigue, irritability and unexpected weight change. Negative for activity change, appetite change, chills, diaphoresis and fever.  HENT: Positive for sneezing. Negative for congestion, dental problem, drooling, ear discharge, ear pain, facial swelling, hearing loss, mouth sores, nosebleeds, postnasal drip, rhinorrhea, sinus pressure, sore throat, tinnitus, trouble swallowing and voice change.   Eyes: Negative.   Respiratory: Positive for chest tightness and shortness of breath. Negative for apnea, cough, choking, wheezing and stridor.   Cardiovascular: Positive for chest pain and palpitations. Negative for leg swelling.  Gastrointestinal: Positive for nausea. Negative for abdominal distention, abdominal pain, anal bleeding, blood in stool, constipation, diarrhea, rectal pain and vomiting.  Genitourinary: Negative.   Musculoskeletal: Negative.  Negative for neck pain and neck stiffness.  Skin: Negative.   Neurological: Positive for dizziness and light-headedness. Negative for tremors, seizures, syncope, facial asymmetry, speech difficulty, weakness, numbness and headaches.  Psychiatric/Behavioral: Positive for agitation, confusion, decreased concentration, depression, dysphoric mood and sleep disturbance. Negative for behavioral problems, hallucinations, self-injury and suicidal ideas. The patient is nervous/anxious. The patient does not have insomnia and is not hyperactive.   Skin: gets acne break outs when nervous.  Physical Exam  Depressive Symptoms: anhedonia, hypersomnia, psychomotor agitation, fatigue, difficulty concentrating, hopelessness, impaired memory, anxiety, panic attacks, weight gain, decreased labido, increased appetite,  (Hypo) Manic Symptoms:   Elevated Mood:  No Irritable Mood:  Yes Grandiosity:  No Distractibility:  Yes Labiality of Mood:  Yes Delusions:  No Hallucinations:  No Impulsivity:  No Sexually Inappropriate Behavior:  No Financial Extravagance:   No Flight of Ideas:  No  Anxiety Symptoms: Excessive Worry:  Yes Panic Symptoms:  Yes Agoraphobia:  No Obsessive Compulsive: No  Symptoms:  Specific Phobias:  No Social Anxiety:  No  Psychotic Symptoms:  Hallucinations: No  Delusions:  No Paranoia:  No   Ideas of Reference:  No  PTSD Symptoms: Ever had a traumatic exposure:  No Had a traumatic exposure in the last month:  No Re-experiencing: No  Hypervigilance:  No Hyperarousal: No  Avoidance: No   Traumatic Brain Injury: No   Past Psychiatric History: Diagnosis: Anxiety and Depression  Hospitalizations: none  Outpatient Care: PCP  Substance Abuse Care: none  Self-Mutilation: none  Suicidal Attempts: none  Violent Behaviors: none   Past Medical History:   Past Medical History:  Diagnosis Date  . Anxiety   . Depression   . Iron deficiency anemia    History of Loss of Consciousness:  No Seizure History:  No Cardiac History:  No Allergies:   Allergies  Allergen Reactions  . Penicillins Other (See Comments)    Joint swelling    Current Medications:  Current Outpatient Prescriptions  Medication Sig Dispense Refill  . escitalopram (LEXAPRO) 20 MG tablet Take 2 tablets (40 mg total) by mouth daily. 60 tablet 3  . hydrOXYzine (VISTARIL) 25 MG capsule Take 1 capsule (25 mg total) by mouth 3 (three) times daily as needed. 90 capsule 2  . Vitamin D, Ergocalciferol, (DRISDOL) 50000 units CAPS capsule Take 50,000 Units by mouth every 7 (seven) days.    Marland Kitchen PROAIR HFA 108 (90 Base) MCG/ACT inhaler      No current facility-administered medications for this visit.     Previous Psychotropic Medications:  Medication Dose  Venlafaxin  37.5 mg BID   Substance Abuse History in the last 12 months: Substance Age of 1st Use Last Use Amount Specific Type  Nicotine  15  20      Alcohol  teens  teens      Cannabis  none        Opiates  none        Cocaine  none        Methamphetamines  none        LSD  none         Ecstasy  none         Benzodiazepines  none        Caffeine  childhood  1 hours ago  20 oz  Diet Coke  Inhalants  none        Others:       Sugar  childhood  today  handful  chocolate covered nuts  Medical Consequences of Substance Abuse: weight gain Legal Consequences of Substance Abuse: none Family Consequences of Substance Abuse: none Blackouts:  No DT's:  No Withdrawal Symptoms:  No   Social History: Current Place of Residence: *[address] [place] [address] Place of Birth: [place], [place] Family Members: husband and son Marital Status:  Married Children: 1  Sons: 1  Daughters: 0 Relationships: husband and son Education:  HS Print production plannerGraduate Educational Problems/Performance: concentration, focus, retention Religious Beliefs/Practices: Advice workerentecostal Holiness History of Abuse: none Armed forces technical officerccupational Experiences; Pensions consultantsecretarial and support Military History:  None. Legal History: none Hobbies/Interests: reading walking and music  Family History:   Family History  Problem Relation Age of Onset  . Anxiety disorder Mother   . Cancer Mother   . Depression Mother   . Alcohol abuse Father   . Anxiety disorder Father   . Cancer Father   . Depression Father   . Alcohol abuse Brother   . Anxiety disorder Brother   . Cancer Brother   . Dementia Maternal Grandmother   . ADD / ADHD Neg Hx   . Bipolar disorder Neg Hx   . Drug abuse Neg Hx   . OCD Neg Hx   . Paranoid behavior Neg Hx   . Schizophrenia Neg Hx   . Seizures Neg Hx   . Sexual abuse Neg Hx   . Physical abuse Neg Hx   . Anxiety disorder Son   . Exostosis Son     Mental Status Examination/Evaluation: Objective:  Appearance: Casual nicely dressed and groomed   Eye Contact::  Good  Speech:  Clear and Coherent  Volume:  Normal  Mood: Good   Affect: Bright   Thought Process:  Coherent, Intact and Logical  Orientation:  Full (Time, Place, and Person)  Thought Content:  WDL  Suicidal Thoughts:  No  Homicidal Thoughts:  No   Judgement:  Good  Insight:  Good  Psychomotor Activity:  Normal  Akathisia:  No  Handed:  Left  AIMS (if indicated):    Assets:  Communication Skills Desire for Improvement   Assessment:    AXIS I Depressive Disorder NOS, Generalized Anxiety Disorder and Panic Disorder  AXIS II Deferred  AXIS III Past Medical History:  Diagnosis Date  . Anxiety   . Depression   . Iron deficiency anemia      AXIS IV other psychosocial or environmental problems  AXIS V 51-60 moderate symptoms   Treatment Plan/Recommendations:  Laboratory:    Psychotherapy: CBT  MedicationsLexapro and Vistaril   Routine PRN Medications:  No  Consultations:  none  Safety Concerns:  none  Other:     Plan/Discussion: I took her vitals.  I reviewed CC, tobacco/med/surg Hx, meds effects/ side effects, problem list, therapies and responses as well as current situation/symptoms discussed options. The patient will To new Lexapro 40 mg daily for depression and anxieyt. She can continue Vistaril 25 mg 3 times a day as needed for anxiety. She'll return in 4 months See orders and pt instructions for more details.  MEDICATIONS this encounter: Meds ordered this encounter  Medications  . PROAIR HFA 108 (90 Base) MCG/ACT inhaler   Medical Decision Making Problem Points:  Established problem, stable/improving (1), Established problem, worsening (2), Review of last therapy session (1) and Review of psycho-social stressors (1) Data Points:  Review or order clinical lab tests (1) Review of medication regiment & side effects (2) Review of new medications or change in dosage (2)  I certify that outpatient services furnished can reasonably be expected to improve the patient's condition.   [name], MD    Patient ID: Whitney Valentine, female   DOB: [birth year], 51 y.o.   MRN: [id number]

## 2017-01-16 ENCOUNTER — Ambulatory Visit (HOSPITAL_COMMUNITY): Payer: Self-pay | Admitting: Psychiatry

## 2017-04-06 ENCOUNTER — Other Ambulatory Visit (HOSPITAL_COMMUNITY): Payer: Self-pay | Admitting: Psychiatry

## 2017-04-07 ENCOUNTER — Telehealth (HOSPITAL_COMMUNITY): Payer: Self-pay | Admitting: *Deleted

## 2017-04-07 NOTE — Telephone Encounter (Signed)
Pt called stand lm on 04-06-2017 at 10:40 am stating she went to her pharmacy to get her Escitalopram 20 mg and the pharmacy informed her they do not have her medication. Per pt chart, her medication was sent to [facility] on 04-06-2017 with 60 tabs 0 refill. Staff called pt on 04-07-2017 at 11:39am and was unable to reach her. Staff lmtcb and to recall her pharmacy about medication. Staff left office number if pt have further concerns.

## 2017-05-08 ENCOUNTER — Ambulatory Visit (INDEPENDENT_AMBULATORY_CARE_PROVIDER_SITE_OTHER): Payer: 59 | Admitting: Psychiatry

## 2017-05-08 ENCOUNTER — Encounter (HOSPITAL_COMMUNITY): Payer: Self-pay | Admitting: Psychiatry

## 2017-05-08 VITALS — BP 140/88 | Ht 65.0 in | Wt 191.0 lb

## 2017-05-08 DIAGNOSIS — R42 Dizziness and giddiness: Secondary | ICD-10-CM | POA: Diagnosis not present

## 2017-05-08 DIAGNOSIS — Z818 Family history of other mental and behavioral disorders: Secondary | ICD-10-CM

## 2017-05-08 DIAGNOSIS — F419 Anxiety disorder, unspecified: Secondary | ICD-10-CM

## 2017-05-08 DIAGNOSIS — R4584 Anhedonia: Secondary | ICD-10-CM | POA: Diagnosis not present

## 2017-05-08 DIAGNOSIS — F410 Panic disorder [episodic paroxysmal anxiety] without agoraphobia: Secondary | ICD-10-CM

## 2017-05-08 DIAGNOSIS — R450 Nervousness: Secondary | ICD-10-CM | POA: Diagnosis not present

## 2017-05-08 DIAGNOSIS — Z810 Family history of intellectual disabilities: Secondary | ICD-10-CM

## 2017-05-08 DIAGNOSIS — R451 Restlessness and agitation: Secondary | ICD-10-CM

## 2017-05-08 DIAGNOSIS — Z811 Family history of alcohol abuse and dependence: Secondary | ICD-10-CM

## 2017-05-08 DIAGNOSIS — F329 Major depressive disorder, single episode, unspecified: Secondary | ICD-10-CM

## 2017-05-08 DIAGNOSIS — R110 Nausea: Secondary | ICD-10-CM

## 2017-05-08 DIAGNOSIS — R0602 Shortness of breath: Secondary | ICD-10-CM | POA: Diagnosis not present

## 2017-05-08 DIAGNOSIS — R5383 Other fatigue: Secondary | ICD-10-CM | POA: Diagnosis not present

## 2017-05-08 MED ORDER — HYDROXYZINE PAMOATE 25 MG PO CAPS: 25.0000 mg | ORAL_CAPSULE | Freq: Three times a day (TID) | ORAL | 2 refills | Status: DC | PRN

## 2017-05-08 MED ORDER — ESCITALOPRAM OXALATE 20 MG PO TABS: 40.0000 mg | ORAL_TABLET | Freq: Every day | ORAL | 3 refills | Status: DC

## 2017-05-08 NOTE — Progress Notes (Signed)
Patient ID: LORIANNA SPADACCINI, female   DOB: 03-13-1966, 51 y.o.   MRN: [id number] Patient ID: DHANYA BOGLE, female   DOB: 1966/02/18, 51 y.o.   MRN: [id number] Patient ID: AMIYAH SHRYOCK, female   DOB: 07/09/1966, 51 y.o.   MRN: [id number] Patient ID: DENETTE HASS, female   DOB: November 10, 1965, 51 y.o.   MRN: [id number] Patient ID: DOMINIQUE RESSEL, female   DOB: 1966-08-03, 51 y.o.   MRN: [id number] Patient ID: SALEM MASTROGIOVANNI, female   DOB: 22-Jul-1966, 51 y.o.   MRN: [id number] Patient ID: DAHIANA KULAK, female   DOB: 01-20-1966, 51 y.o.   MRN: [id number] Patient ID: ULYANA PITONES, female   DOB: 1966/05/14, 51 y.o.   MRN: [id number] Patient ID: RAYA MCKINSTRY, female   DOB: [birth year], 51 y.o.   MRN: [id number] Patient ID: NUBIA ZIESMER, female   DOB: [birth year], 51 y.o.   MRN: [id number] Patient ID: RAEGEN TARPLEY, female   DOB: 1966/07/27, 51 y.o.   MRN: [id number] [facility] [address] Progress Note Whitney Valentine MRN: [id number] DOB: [birth year] Age: 51 y.o.  Date: 05/08/2017 Start Time: 2:50 PM End Time: 3:21 PM  Chief Complaint: No chief complaint on file.  Subjective: This patient is a 51 year-old married white female who lives with her husband and 44 year-old son in [place]. 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  The patient states that she has has significant anxiety and depression for the last 3 years. 3 years ago her mother and her brother both died of cancer and she had been her primary caregiver. Her father had died several years prior and now she is the only one left. She's finds that she cries easily and gets anxious and flustered. She still has panic attacks. She also is probably starting to go through menopause she's not had a period in several months. She will be a beginning with her gynecologist until December. She's having frequent hot flashes.  She returns after 4 weeks. She is now on the Lexapro and Vistaril.  For the most part she does pretty well. However she gets anxious when she has to learn new things at work particular things relating to technology in the computer. She realizes that she didn't grow up with this technology and it still makes her very anxious. She doesn't want to change her medicine but is willing to work with a therapist on reducing anxiety :  Anxiety  Presents for initial visit. Onset was more than 5 years ago. The problem has been waxing and waning. Symptoms include chest pain, confusion, decreased concentration, depressed mood, dizziness, excessive worry, feeling of choking, hyperventilation, irritability, malaise, muscle tension, nausea, nervous/anxious behavior, palpitations, panic and shortness of breath. Patient reports no compulsions, dry mouth, insomnia, obsessions, restlessness or suicidal ideas. Symptoms occur constantly. The severity of symptoms is causing significant distress. The symptoms are aggravated by caffeine, family issues, social activities and work stress. The patient sleeps 13 hours per night. The quality of sleep is non-restorative. Nighttime awakenings: occasional.   Risk factors include family history, a major life event and marital problems. Her past medical history is significant for anxiety/panic attacks, arrhythmia and depression. There is no history of anemia, asthma, bipolar disorder, CAD, CHF, chronic lung disease, fibromyalgia, hyperthyroidism or suicide attempts. Past treatments include non-SSRI antidepressants and herbal remedies. The treatment provided mild relief. Compliance with prior treatments has been good.  Depression       The patient presents with  depression.  Associated symptoms include decreased concentration and fatigue.  Associated symptoms include does not have insomnia, no restlessness, no appetite change, no headaches and no suicidal ideas.  Past medical history includes anxiety and depression.     Pertinent negatives include no suicide  attempts.  Review of Systems  Constitutional: Positive for fatigue, irritability and unexpected weight change. Negative for activity change, appetite change, chills, diaphoresis and fever.  HENT: Positive for sneezing. Negative for congestion, dental problem, drooling, ear discharge, ear pain, facial swelling, hearing loss, mouth sores, nosebleeds, postnasal drip, rhinorrhea, sinus pressure, sore throat, tinnitus, trouble swallowing and voice change.   Eyes: Negative.   Respiratory: Positive for chest tightness and shortness of breath. Negative for apnea, cough, choking, wheezing and stridor.   Cardiovascular: Positive for chest pain and palpitations. Negative for leg swelling.  Gastrointestinal: Positive for nausea. Negative for abdominal distention, abdominal pain, anal bleeding, blood in stool, constipation, diarrhea, rectal pain and vomiting.  Genitourinary: Negative.   Musculoskeletal: Negative.  Negative for neck pain and neck stiffness.  Skin: Negative.   Neurological: Positive for dizziness and light-headedness. Negative for tremors, seizures, syncope, facial asymmetry, speech difficulty, weakness, numbness and headaches.  Psychiatric/Behavioral: Positive for agitation, confusion, decreased concentration, depression, dysphoric mood and sleep disturbance. Negative for behavioral problems, hallucinations, self-injury and suicidal ideas. The patient is nervous/anxious. The patient does not have insomnia and is not hyperactive.   Skin: gets acne break outs when nervous.  Physical Exam  Depressive Symptoms: anhedonia, hypersomnia, psychomotor agitation, fatigue, difficulty concentrating, hopelessness, impaired memory, anxiety, panic attacks, weight gain, decreased labido, increased appetite,  (Hypo) Manic Symptoms:   Elevated Mood:  No Irritable Mood:  Yes Grandiosity:  No Distractibility:  Yes Labiality of Mood:  Yes Delusions:  No Hallucinations:  No Impulsivity:   No Sexually Inappropriate Behavior:  No Financial Extravagance:  No Flight of Ideas:  No  Anxiety Symptoms: Excessive Worry:  Yes Panic Symptoms:  Yes Agoraphobia:  No Obsessive Compulsive: No  Symptoms:  Specific Phobias:  No Social Anxiety:  No  Psychotic Symptoms:  Hallucinations: No  Delusions:  No Paranoia:  No   Ideas of Reference:  No  PTSD Symptoms: Ever had a traumatic exposure:  No Had a traumatic exposure in the last month:  No Re-experiencing: No  Hypervigilance:  No Hyperarousal: No  Avoidance: No   Traumatic Brain Injury: No   Past Psychiatric History: Diagnosis: Anxiety and Depression  Hospitalizations: none  Outpatient Care: PCP  Substance Abuse Care: none  Self-Mutilation: none  Suicidal Attempts: none  Violent Behaviors: none   Past Medical History:   Past Medical History:  Diagnosis Date  . Anxiety   . Depression   . Iron deficiency anemia    History of Loss of Consciousness:  No Seizure History:  No Cardiac History:  No Allergies:   Allergies  Allergen Reactions  . Penicillins Other (See Comments)    Joint swelling    Current Medications:  Current Outpatient Prescriptions  Medication Sig Dispense Refill  . escitalopram (LEXAPRO) 20 MG tablet Take 2 tablets (40 mg total) by mouth daily. 60 tablet 3  . hydrOXYzine (VISTARIL) 25 MG capsule Take 1 capsule (25 mg total) by mouth 3 (three) times daily as needed. 90 capsule 2  . PROAIR HFA 108 (90 Base) MCG/ACT inhaler     . Vitamin D, Ergocalciferol, (DRISDOL) 50000 units CAPS capsule Take 50,000 Units by mouth every 7 (seven) days.     No current facility-administered medications for this  visit.     Previous Psychotropic Medications:  Medication Dose   Venlafaxin  37.5 mg BID   Substance Abuse History in the last 12 months: Substance Age of 1st Use Last Use Amount Specific Type  Nicotine  15  20      Alcohol  teens  teens      Cannabis  none        Opiates  none         Cocaine  none        Methamphetamines  none        LSD  none        Ecstasy  none         Benzodiazepines  none        Caffeine  childhood  1 hours ago  20 oz  Diet Coke  Inhalants  none        Others:       Sugar  childhood  today  handful  chocolate covered nuts  Medical Consequences of Substance Abuse: weight gain Legal Consequences of Substance Abuse: none Family Consequences of Substance Abuse: none Blackouts:  No DT's:  No Withdrawal Symptoms:  No   Social History: Current Place of Residence: *[address] [place] [id number] Place of Birth: [place], [place] Family Members: husband and son Marital Status:  Married Children: 1  Sons: 1  Daughters: 0 Relationships: husband and son Education:  HS Print production plannerGraduate Educational Problems/Performance: concentration, focus, retention Religious Beliefs/Practices: Advice workerentecostal Holiness History of Abuse: none Armed forces technical officerccupational Experiences; Pensions consultantsecretarial and support Military History:  None. Legal History: none Hobbies/Interests: reading walking and music  Family History:   Family History  Problem Relation Age of Onset  . Anxiety disorder Mother   . Cancer Mother   . Depression Mother   . Alcohol abuse Father   . Anxiety disorder Father   . Cancer Father   . Depression Father   . Alcohol abuse Brother   . Anxiety disorder Brother   . Cancer Brother   . Dementia Maternal Grandmother   . ADD / ADHD Neg Hx   . Bipolar disorder Neg Hx   . Drug abuse Neg Hx   . OCD Neg Hx   . Paranoid behavior Neg Hx   . Schizophrenia Neg Hx   . Seizures Neg Hx   . Sexual abuse Neg Hx   . Physical abuse Neg Hx   . Anxiety disorder Son   . Exostosis Son     Mental Status Examination/Evaluation: Objective:  Appearance: Casual nicely dressed and groomed   Eye Contact::  Good  Speech:  Clear and Coherent  Volume:  Normal  Mood: Good ,A little anxious   Affect: Bright   Thought Process:  Coherent, Intact and Logical  Orientation:  Full (Time, Place,  and Person)  Thought Content:  WDL  Suicidal Thoughts:  No  Homicidal Thoughts:  No  Judgement:  Good  Insight:  Good  Psychomotor Activity:  Normal  Akathisia:  No  Handed:  Left  AIMS (if indicated):    Assets:  Communication Skills Desire for Improvement   Assessment:    AXIS I Depressive Disorder NOS, Generalized Anxiety Disorder and Panic Disorder  AXIS II Deferred  AXIS III Past Medical History:  Diagnosis Date  . Anxiety   . Depression   . Iron deficiency anemia      AXIS IV other psychosocial or environmental problems  AXIS V 51-60 moderate symptoms   Treatment Plan/Recommendations:  Laboratory:  Psychotherapy: CBT  MedicationsLexapro and Vistaril   Routine PRN Medications:  No  Consultations: none  Safety Concerns:  none  Other:     Plan/Discussion: I took her vitals.  I reviewed CC, tobacco/med/surg Hx, meds effects/ side effects, problem list, therapies and responses as well as current situation/symptoms discussed options. The patient will To new Lexapro 40 mg daily for depression and anxieyt. She can continue Vistaril 25 mg 3 times a day as needed for anxiety.She'll be scheduled with Florencia Reasons here She'll return in 4 months See orders and pt instructions for more details.  MEDICATIONS this encounter: Meds ordered this encounter  Medications  . escitalopram (LEXAPRO) 20 MG tablet    Sig: Take 2 tablets (40 mg total) by mouth daily.    Dispense:  60 tablet    Refill:  3  . hydrOXYzine (VISTARIL) 25 MG capsule    Sig: Take 1 capsule (25 mg total) by mouth 3 (three) times daily as needed.    Dispense:  90 capsule    Refill:  2   Medical Decision Making Problem Points:  Established problem, stable/improving (1), Established problem, worsening (2), Review of last therapy session (1) and Review of psycho-social stressors (1) Data Points:  Review or order clinical lab tests (1) Review of medication regiment & side effects (2) Review of new medications  or change in dosage (2)  I certify that outpatient services furnished can reasonably be expected to improve the patient's condition.   [name], MD    Patient ID: ALYSHA DOOLAN, female   DOB: May 09, 1966, 51 y.o.   MRN: [id number]

## 2017-09-05 ENCOUNTER — Ambulatory Visit (HOSPITAL_COMMUNITY): Payer: Self-pay | Admitting: Psychiatry

## 2017-09-19 ENCOUNTER — Other Ambulatory Visit (HOSPITAL_COMMUNITY): Payer: Self-pay | Admitting: Psychiatry

## 2017-10-02 ENCOUNTER — Ambulatory Visit (HOSPITAL_COMMUNITY): Payer: Self-pay | Admitting: Psychiatry

## 2017-10-03 ENCOUNTER — Encounter (HOSPITAL_COMMUNITY): Payer: Self-pay | Admitting: Psychiatry

## 2017-10-03 ENCOUNTER — Ambulatory Visit (HOSPITAL_COMMUNITY): Payer: 59 | Admitting: Psychiatry

## 2017-10-03 VITALS — BP 126/85 | HR 87 | Ht 65.0 in | Wt 192.0 lb

## 2017-10-03 DIAGNOSIS — Z811 Family history of alcohol abuse and dependence: Secondary | ICD-10-CM | POA: Diagnosis not present

## 2017-10-03 DIAGNOSIS — Z818 Family history of other mental and behavioral disorders: Secondary | ICD-10-CM

## 2017-10-03 DIAGNOSIS — G4733 Obstructive sleep apnea (adult) (pediatric): Secondary | ICD-10-CM | POA: Diagnosis not present

## 2017-10-03 DIAGNOSIS — F419 Anxiety disorder, unspecified: Secondary | ICD-10-CM

## 2017-10-03 DIAGNOSIS — Z79899 Other long term (current) drug therapy: Secondary | ICD-10-CM

## 2017-10-03 DIAGNOSIS — F329 Major depressive disorder, single episode, unspecified: Secondary | ICD-10-CM

## 2017-10-03 DIAGNOSIS — Z634 Disappearance and death of family member: Secondary | ICD-10-CM

## 2017-10-03 MED ORDER — ESCITALOPRAM OXALATE 20 MG PO TABS: 40.0000 mg | ORAL_TABLET | Freq: Every day | ORAL | 5 refills | Status: DC

## 2017-10-03 MED ORDER — HYDROXYZINE PAMOATE 25 MG PO CAPS: 25.0000 mg | ORAL_CAPSULE | Freq: Every evening | ORAL | 5 refills | Status: DC | PRN

## 2017-10-03 NOTE — Progress Notes (Signed)
BH MD/PA/NP OP Progress Note  10/03/2017 4:47 PM Whitney Valentine  MRN:  [id number]  Chief Complaint:  Chief Complaint    Depression; Anxiety; Follow-up     HPI: This patient is a 52 year-old married white female who lives with her husband and 54 year-old son in [place]. 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  The patient states that she has has significant anxiety and depression for the last 3 years. 3 years ago her mother and her brother both died of cancer and she had been her primary caregiver. Her father had died several years prior and now she is the only one left. She's finds that she cries easily and gets anxious and flustered. She still has panic attacks. She also is probably starting to go through menopause she's not had a period in several months. She will be a beginning with her gynecologist until December. She's having frequent hot flashes.  She returns after 6 months.  She recently had a sleep study and found out she has severe sleep apnea.  She is probably going to have to start CPAP.  Her mood has been generally good but she thinks if the treatment for sleep apnea really helps her mood and energy she would like to get off the antidepressants.  She has been functioning well at work but her energy has been low Visit Diagnosis:    ICD-10-CM   1. Anxiety and depression F41.9    F32.9     Past Psychiatric History: none  Past Medical History:  Past Medical History:  Diagnosis Date  . Anxiety   . Depression   . Iron deficiency anemia     Past Surgical History:  Procedure Laterality Date  . CESAREAN SECTION      Family Psychiatric History: See below  Family History:  Family History  Problem Relation Age of Onset  . Anxiety disorder Mother   . Cancer Mother   . Depression Mother   . Alcohol abuse Father   . Anxiety disorder Father   . Cancer Father   . Depression Father   . Alcohol abuse Brother   . Anxiety disorder Brother   . Cancer Brother    . Dementia Maternal Grandmother   . ADD / ADHD Neg Hx   . Bipolar disorder Neg Hx   . Drug abuse Neg Hx   . OCD Neg Hx   . Paranoid behavior Neg Hx   . Schizophrenia Neg Hx   . Seizures Neg Hx   . Sexual abuse Neg Hx   . Physical abuse Neg Hx   . Anxiety disorder Son   . Exostosis Son     Social History:  Social History   Socioeconomic History  . Marital status: Married    Spouse name: None  . Number of children: None  . Years of education: None  . Highest education level: None  Social Needs  . Financial resource strain: None  . Food insecurity - worry: None  . Food insecurity - inability: None  . Transportation needs - medical: None  . Transportation needs - non-medical: None  Occupational History  . None  Tobacco Use  . Smoking status: Never Smoker  . Smokeless tobacco: Never Used  Substance and Sexual Activity  . Alcohol use: No  . Drug use: No  . Sexual activity: No  Other Topics Concern  . None  Social History Narrative  . None    Allergies:  Allergies  Allergen Reactions  .  Penicillins Other (See Comments)    Joint swelling     Metabolic Disorder Labs: No results found for: HGBA1C, MPG No results found for: PROLACTIN No results found for: CHOL, TRIG, HDL, CHOLHDL, VLDL, LDLCALC Lab Results  Component Value Date   TSH 0.487 01/15/2013    Therapeutic Level Labs: No results found for: LITHIUM No results found for: VALPROATE No components found for:  CBMZ  Current Medications: Current Outpatient Medications  Medication Sig Dispense Refill  . escitalopram (LEXAPRO) 20 MG tablet Take 2 tablets (40 mg total) by mouth daily. 60 tablet 5  . hydrOXYzine (VISTARIL) 25 MG capsule Take 1 capsule (25 mg total) by mouth at bedtime as needed. 30 capsule 5  . PROAIR HFA 108 (90 Base) MCG/ACT inhaler     . Vitamin D, Ergocalciferol, (DRISDOL) 50000 units CAPS capsule Take 50,000 Units by mouth every 7 (seven) days.     No current facility-administered  medications for this visit.      Musculoskeletal: Strength & Muscle Tone: within normal limits Gait & Station: normal Patient leans: N/A  Psychiatric Specialty Exam: Review of Systems  Constitutional: Positive for malaise/fatigue.  All other systems reviewed and are negative.   Blood pressure 126/85, pulse 87, height 5\' 5"  (1.651 m), weight 192 lb (87.1 kg), SpO2 96 %.Body mass index is 31.95 kg/m.  General Appearance: Casual and Fairly Groomed  Eye Contact:  Good  Speech:  Clear and Coherent  Volume:  Normal  Mood:  Euthymic  Affect:  Congruent  Thought Process:  Goal Directed  Orientation:  Full (Time, Place, and Person)  Thought Content: WDL   Suicidal Thoughts:  No  Homicidal Thoughts:  No  Memory:  Immediate;   Good Recent;   Good Remote;   Fair  Judgement:  Good  Insight:  Good  Psychomotor Activity:  Decreased  Concentration:  Concentration: Fair and Attention Span: Fair  Recall:  Good  Fund of Knowledge: Good  Language: Good  Akathisia:  No  Handed:  Right  AIMS (if indicated): not done  Assets:  Communication Skills Desire for Improvement Physical Health Resilience Social Support Talents/Skills  ADL's:  Intact  Cognition: WNL  Sleep:  Fair   Screenings:   Assessment and Plan: Patient is a 52 year old female with a history of depression and anxiety.  She is doing well on her current occasions and therefore will continue Lexapro 40 mg daily and Vistaril 25 mg at needed for sleep at bedtime.  She is going to start her treatment for sleep apnea and will let me know how this is going.  Otherwise she will return to see me in 6 months   [name], MD 10/03/2017, 4:47 PM

## 2018-08-20 ENCOUNTER — Other Ambulatory Visit (HOSPITAL_COMMUNITY): Payer: Self-pay | Admitting: Psychiatry

## 2018-11-21 ENCOUNTER — Other Ambulatory Visit: Payer: Self-pay

## 2018-11-21 ENCOUNTER — Ambulatory Visit (INDEPENDENT_AMBULATORY_CARE_PROVIDER_SITE_OTHER): Payer: 59 | Admitting: Psychiatry

## 2018-11-21 ENCOUNTER — Encounter (HOSPITAL_COMMUNITY): Payer: Self-pay | Admitting: Psychiatry

## 2018-11-21 DIAGNOSIS — F419 Anxiety disorder, unspecified: Secondary | ICD-10-CM

## 2018-11-21 DIAGNOSIS — F329 Major depressive disorder, single episode, unspecified: Secondary | ICD-10-CM | POA: Diagnosis not present

## 2018-11-21 DIAGNOSIS — F32A Depression, unspecified: Secondary | ICD-10-CM

## 2018-11-21 MED ORDER — HYDROXYZINE PAMOATE 25 MG PO CAPS: 25.0000 mg | ORAL_CAPSULE | Freq: Every evening | ORAL | 5 refills | Status: DC | PRN

## 2018-11-21 MED ORDER — ESCITALOPRAM OXALATE 20 MG PO TABS: 40.0000 mg | ORAL_TABLET | Freq: Every day | ORAL | 5 refills | Status: DC

## 2018-11-21 NOTE — Progress Notes (Signed)
Virtual Visit via Telephone Note  I connected with Whitney Valentine on 11/21/18 at  3:20 PM EDT by telephone and verified that I am speaking with the correct person using two identifiers.   I discussed the limitations, risks, security and privacy concerns of performing an evaluation and management service by telephone and the availability of in person appointments. I also discussed with the patient that there may be a patient responsible charge related to this service. The patient expressed understanding and agreed to proceed    I discussed the assessment and treatment plan with the patient. The patient was provided an opportunity to ask questions and all were answered. The patient agreed with the plan and demonstrated an understanding of the instructions.   The patient was advised to call back or seek an in-person evaluation if the symptoms worsen or if the condition fails to improve as anticipated.  I provided 15 minutes of non-face-to-face time during this encounter.   [name], MD  [facility] MD/PA/NP OP Progress Note  11/21/2018 3:35 PM Whitney Valentine  MRN:  [id number]  Chief Complaint:  Chief Complaint    Depression; Anxiety; Follow-up     HPI: This patient is a 53 year old married white female who lives with her husband and 26 year old son in [place].  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  The patient is evaluated through phone evaluation due to the coronavirus epidemic.  She had not been seen in about 13 months.  Last visit she states that she wanted to get off the Lexapro because she thought she was doing well and did not particularly need it.  However over the last 2 to 3 months she has noticed that she feels sad and has been crying easily is more irritable and anxious.  She is not suicidal.  Her sleeping is variable and she no longer has the Vistaril to take for sleep.  However she goes to bed around 8 PM every night and does not seem to have much energy or  motivation.  She is still able to do her job.  The patient does not note any other psychiatric symptoms.  Her health is generally good and she is not on any new medications. Visit Diagnosis:    ICD-10-CM   1. Anxiety and depression F41.9    F32.9     Past Psychiatric History: none  Past Medical History:  Past Medical History:  Diagnosis Date  . Anxiety   . Depression   . Iron deficiency anemia     Past Surgical History:  Procedure Laterality Date  . CESAREAN SECTION      Family Psychiatric History: See below  Family History:  Family History  Problem Relation Age of Onset  . Anxiety disorder Mother   . Cancer Mother   . Depression Mother   . Alcohol abuse Father   . Anxiety disorder Father   . Cancer Father   . Depression Father   . Alcohol abuse Brother   . Anxiety disorder Brother   . Cancer Brother   . Dementia Maternal Grandmother   . ADD / ADHD Neg Hx   . Bipolar disorder Neg Hx   . Drug abuse Neg Hx   . OCD Neg Hx   . Paranoid behavior Neg Hx   . Schizophrenia Neg Hx   . Seizures Neg Hx   . Sexual abuse Neg Hx   . Physical abuse Neg Hx   . Anxiety disorder Son   . Exostosis Son  Social History:  Social History   Socioeconomic History  . Marital status: Married    Spouse name: Not on file  . Number of children: Not on file  . Years of education: Not on file  . Highest education level: Not on file  Occupational History  . Not on file  Social Needs  . Financial resource strain: Not on file  . Food insecurity:    Worry: Not on file    Inability: Not on file  . Transportation needs:    Medical: Not on file    Non-medical: Not on file  Tobacco Use  . Smoking status: Never Smoker  . Smokeless tobacco: Never Used  Substance and Sexual Activity  . Alcohol use: No  . Drug use: No  . Sexual activity: Never  Lifestyle  . Physical activity:    Days per week: Not on file    Minutes per session: Not on file  . Stress: Not on file   Relationships  . Social connections:    Talks on phone: Not on file    Gets together: Not on file    Attends religious service: Not on file    Active member of club or organization: Not on file    Attends meetings of clubs or organizations: Not on file    Relationship status: Not on file  Other Topics Concern  . Not on file  Social History Narrative  . Not on file    Allergies:  Allergies  Allergen Reactions  . Penicillins Other (See Comments)    Joint swelling     Metabolic Disorder Labs: No results found for: HGBA1C, MPG No results found for: PROLACTIN No results found for: CHOL, TRIG, HDL, CHOLHDL, VLDL, LDLCALC Lab Results  Component Value Date   TSH 0.487 01/15/2013    Therapeutic Level Labs: No results found for: LITHIUM No results found for: VALPROATE No components found for:  CBMZ  Current Medications: Current Outpatient Medications  Medication Sig Dispense Refill  . escitalopram (LEXAPRO) 20 MG tablet Take 2 tablets (40 mg total) by mouth daily. 60 tablet 5  . hydrOXYzine (VISTARIL) 25 MG capsule Take 1 capsule (25 mg total) by mouth at bedtime as needed. 30 capsule 5  . PROAIR HFA 108 (90 Base) MCG/ACT inhaler      No current facility-administered medications for this visit.      Musculoskeletal: Strength & Muscle Tone: Not able to assess during telephone visit Gait & Station: Patient leans:   Psychiatric Specialty Exam: Review of Systems  Psychiatric/Behavioral: Positive for depression. The patient is nervous/anxious.   All other systems reviewed and are negative.   There were no vitals taken for this visit.There is no height or weight on file to calculate BMI.  General Appearance: NA  Eye Contact:  NA  Speech:  Clear and Coherent  Volume:  Normal  Mood:  Anxious and Dysphoric  Affect:  NA  Thought Process:  Goal Directed  Orientation:  Full (Time, Place, and Person)  Thought Content: Rumination   Suicidal Thoughts:  No  Homicidal  Thoughts:  No  Memory:  Immediate;   Good Recent;   Good Remote;   Good  Judgement:  Good  Insight:  Fair  Psychomotor Activity:  Decreased  Concentration:  Concentration: Good and Attention Span: Good  Recall:  Good  Fund of Knowledge: Good  Language: Good  Akathisia:  No  Handed:  Right  AIMS (if indicated): not done  Assets:  Communication Skills Desire for  Improvement Physical Health Resilience Social Support Talents/Skills  ADL's:  Intact  Cognition: WNL  Sleep:  Fair   Screenings:   Assessment and Plan: This patient is a 53 year old female with a history of depression and anxiety.  She has been off her medication for several months and is not doing as well.  She will restart Lexapro 20 mg daily for 2 weeks and then advance to 20 mg twice daily.  She will restart Vistaril 25 mg at bedtime to help with sleep.  She will return to see me in 6 weeks   [name], MD 11/21/2018, 3:35 PM

## 2019-01-02 ENCOUNTER — Encounter (HOSPITAL_COMMUNITY): Payer: Self-pay | Admitting: Psychiatry

## 2019-01-02 ENCOUNTER — Ambulatory Visit (INDEPENDENT_AMBULATORY_CARE_PROVIDER_SITE_OTHER): Payer: 59 | Admitting: Psychiatry

## 2019-01-02 ENCOUNTER — Other Ambulatory Visit: Payer: Self-pay

## 2019-01-02 DIAGNOSIS — F419 Anxiety disorder, unspecified: Secondary | ICD-10-CM | POA: Diagnosis not present

## 2019-01-02 DIAGNOSIS — F329 Major depressive disorder, single episode, unspecified: Secondary | ICD-10-CM

## 2019-01-02 MED ORDER — ESCITALOPRAM OXALATE 20 MG PO TABS: 40.0000 mg | ORAL_TABLET | Freq: Every day | ORAL | 5 refills | Status: DC

## 2019-01-02 MED ORDER — HYDROXYZINE PAMOATE 25 MG PO CAPS: 25.0000 mg | ORAL_CAPSULE | Freq: Every evening | ORAL | 5 refills | Status: DC | PRN

## 2019-01-02 NOTE — Progress Notes (Signed)
Virtual Visit via Telephone Note  I connected with Whitney Valentine on 01/02/19 at  3:20 PM EDT by telephone and verified that I am speaking with the correct person using two identifiers.   I discussed the limitations, risks, security and privacy concerns of performing an evaluation and management service by telephone and the availability of in person appointments. I also discussed with the patient that there may be a patient responsible charge related to this service. The patient expressed understanding and agreed to proceed.      I discussed the assessment and treatment plan with the patient. The patient was provided an opportunity to ask questions and all were answered. The patient agreed with the plan and demonstrated an understanding of the instructions.   The patient was advised to call back or seek an in-person evaluation if the symptoms worsen or if the condition fails to improve as anticipated.  I provided 15 minutes of non-face-to-face time during this encounter.   Whitney Valentine , MD  [facility] OP Progress Note  01/02/2019 3:35 PM Whitney NapoleonKimberly M Valentine  MRN:  [id number]  Chief Complaint:  Chief Complaint    Depression; Anxiety; Follow-up     HPI: This patient is a 53 year old married white female who lives with her husband and 55 year old son in [place].  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  The patient is again evaluated via phone due to the coronavirus pandemic.  She was last seen a month ago.  At that time she had gotten off her medication and was not doing as well.  She was more tearful irritable and having trouble sleeping as well as crying spells.  She restarted the Lexapro and now is up to 40 mg daily as well as hydroxyzine 25 mg at bedtime.  She states that she is feeling much better.  She also notes that she is now on diltiazem 180 mg daily for blood pressure and it is working well.  Her mood has been good her energy is improving  and she is sleeping much better at night.  She is no longer having the tearfulness and crying like she had in the past. Visit Diagnosis:    ICD-10-CM   1. Anxiety and depression F41.9    F32.9     Past Psychiatric History: none  Past Medical History:  Past Medical History:  Diagnosis Date  . Anxiety   . Depression   . Iron deficiency anemia     Past Surgical History:  Procedure Laterality Date  . CESAREAN SECTION      Family Psychiatric History: See below  Family History:  Family History  Problem Relation Age of Onset  . Anxiety disorder Mother   . Cancer Mother   . Depression Mother   . Alcohol abuse Father   . Anxiety disorder Father   . Cancer Father   . Depression Father   . Alcohol abuse Brother   . Anxiety disorder Brother   . Cancer Brother   . Dementia Maternal Grandmother   . ADD / ADHD Neg Hx   . Bipolar disorder Neg Hx   . Drug abuse Neg Hx   . OCD Neg Hx   . Paranoid behavior Neg Hx   . Schizophrenia Neg Hx   . Seizures Neg Hx   . Sexual abuse Neg Hx   . Physical abuse Neg Hx   . Anxiety disorder Son   . Exostosis Son     Social History:  Social  History   Socioeconomic History  . Marital status: Married    Spouse name: Not on file  . Number of children: Not on file  . Years of education: Not on file  . Highest education level: Not on file  Occupational History  . Not on file  Social Needs  . Financial resource strain: Not on file  . Food insecurity:    Worry: Not on file    Inability: Not on file  . Transportation needs:    Medical: Not on file    Non-medical: Not on file  Tobacco Use  . Smoking status: Never Smoker  . Smokeless tobacco: Never Used  Substance and Sexual Activity  . Alcohol use: No  . Drug use: No  . Sexual activity: Never  Lifestyle  . Physical activity:    Days per week: Not on file    Minutes per session: Not on file  . Stress: Not on file  Relationships  . Social connections:    Talks on phone: Not on  file    Gets together: Not on file    Attends religious service: Not on file    Active member of club or organization: Not on file    Attends meetings of clubs or organizations: Not on file    Relationship status: Not on file  Other Topics Concern  . Not on file  Social History Narrative  . Not on file    Allergies:  Allergies  Allergen Reactions  . Penicillins Other (See Comments)    Joint swelling     Metabolic Disorder Labs: No results found for: HGBA1C, MPG No results found for: PROLACTIN No results found for: CHOL, TRIG, HDL, CHOLHDL, VLDL, LDLCALC Lab Results  Component Value Date   TSH 0.487 01/15/2013    Therapeutic Level Labs: No results found for: LITHIUM No results found for: VALPROATE No components found for:  CBMZ  Current Medications: Current Outpatient Medications  Medication Sig Dispense Refill  . diltiazem (CARDIZEM CD) 180 MG 24 hr capsule     . escitalopram (LEXAPRO) 20 MG tablet Take 2 tablets (40 mg total) by mouth daily. 60 tablet 5  . hydrOXYzine (VISTARIL) 25 MG capsule Take 1 capsule (25 mg total) by mouth at bedtime as needed. 30 capsule 5  . PROAIR HFA 108 (90 Base) MCG/ACT inhaler      No current facility-administered medications for this visit.      Musculoskeletal: Strength & Muscle Tone: within normal limits Gait & Station: normal Patient leans: N/A  Psychiatric Specialty Exam: Review of Systems  All other systems reviewed and are negative.   There were no vitals taken for this visit.There is no height or weight on file to calculate BMI.  General Appearance: NA  Eye Contact:N/A  Speech:  Clear and Coherent  Volume:  Normal  Mood:  Euthymic  Affect:N/A  Thought Process:  Goal Directed  Orientation:  Full (Time, Place, and Person)  Thought Content: WDL   Suicidal Thoughts:  No  Homicidal Thoughts:  No  Memory:  Immediate;   Good Recent;   Good Remote;   Fair  Judgement:  Good  Insight:  Good  Psychomotor Activity:   Normal  Concentration:  Concentration: Good and Attention Span: Good  Recall:  Good  Fund of Knowledge: Good  Language: Good  Akathisia:  No  Handed:  Right  AIMS (if indicated): not done  Assets:  Communication Skills Desire for Improvement Physical Health Resilience Social Support Talents/Skills Vocational/Educational  ADL's:  Intact  Cognition: WNL  Sleep:  Good   Screenings:   Assessment and Plan: This patient is a 53 year old female with a history of depression and anxiety.  She is doing better now that she is back on her medication.  She will continue Lexapro 20 mg twice daily for depression and anxiety and Vistaril 25 mg at bedtime as needed for sleep.  She will return to see me in 3 months   Whitney Ruder, MD 01/02/2019, 3:35 PM

## 2019-06-06 ENCOUNTER — Other Ambulatory Visit (HOSPITAL_COMMUNITY): Payer: Self-pay | Admitting: Psychiatry

## 2019-06-06 NOTE — Telephone Encounter (Signed)
Call pt for appt 

## 2019-07-09 ENCOUNTER — Ambulatory Visit (INDEPENDENT_AMBULATORY_CARE_PROVIDER_SITE_OTHER): Payer: 59 | Admitting: Psychiatry

## 2019-07-09 ENCOUNTER — Encounter (HOSPITAL_COMMUNITY): Payer: Self-pay | Admitting: Psychiatry

## 2019-07-09 ENCOUNTER — Other Ambulatory Visit: Payer: Self-pay

## 2019-07-09 DIAGNOSIS — F419 Anxiety disorder, unspecified: Secondary | ICD-10-CM | POA: Diagnosis not present

## 2019-07-09 DIAGNOSIS — F329 Major depressive disorder, single episode, unspecified: Secondary | ICD-10-CM

## 2019-07-09 MED ORDER — ESCITALOPRAM OXALATE 20 MG PO TABS: 40.0000 mg | ORAL_TABLET | Freq: Every day | ORAL | 5 refills | Status: DC

## 2019-07-09 MED ORDER — HYDROXYZINE PAMOATE 25 MG PO CAPS: ORAL_CAPSULE | ORAL | 5 refills | Status: DC

## 2019-07-09 NOTE — Progress Notes (Signed)
Virtual Visit via Telephone Note  I connected with Whitney Valentine on 07/09/19 at  1:40 PM EST by telephone and verified that I am speaking with the correct person using two identifiers.   I discussed the limitations, risks, security and privacy concerns of performing an evaluation and management service by telephone and the availability of in person appointments. I also discussed with the patient that there may be a patient responsible charge related to this service. The patient expressed understanding and agreed to proceed.     I discussed the assessment and treatment plan with the patient. The patient was provided an opportunity to ask questions and all were answered. The patient agreed with the plan and demonstrated an understanding of the instructions.   The patient was advised to call back or seek an in-person evaluation if the symptoms worsen or if the condition fails to improve as anticipated.  I provided 15 minutes of non-face-to-face time during this encounter.   Levonne Spiller, MD  [facility] MD/PA/NP OP Progress Note  07/09/2019 1:58 PM Whitney Valentine  MRN:  [id number]  Chief Complaint:  Chief Complaint    Depression; Anxiety; Follow-up     HPI: This patient is a 53 year old married white female who lives with her husband and 57 year old son in [place].  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  The patient is evaluated after 6 months.  She has remained on her medications-Lexapro 40 mg daily for depression and hydroxyzine 25 mg at bedtime as needed for sleep.  She has been under a lot of stress lately.  Her husband had to have knee replacement in early September and is still struggling to regain his strength.  He has many other medical issues including having had a kidney transplant.  She is going home on her lunch hour to check on him and having to do a lot of extra work around the house.  Her son is helping as much as possible but he is also  working and going to school.  The patient finds that she is very exhausted on the weekend and ends up sleeping late.  She is not getting much exercise of fresh air and I encouraged her to do so.  She is staying in touch with friends.  She really enjoys her work and finds it a respite from the stressful home life.  She denies serious depression or suicidal ideation and thinks overall she is doing well but is just going through a difficult time with her husband right now. Visit Diagnosis:    ICD-10-CM   1. Anxiety and depression  F41.9    F32.9     Past Psychiatric History: none  Past Medical History:  Past Medical History:  Diagnosis Date  . Anxiety   . Depression   . Iron deficiency anemia     Past Surgical History:  Procedure Laterality Date  . CESAREAN SECTION      Family Psychiatric History: see below  Family History:  Family History  Problem Relation Age of Onset  . Anxiety disorder Mother   . Cancer Mother   . Depression Mother   . Alcohol abuse Father   . Anxiety disorder Father   . Cancer Father   . Depression Father   . Alcohol abuse Brother   . Anxiety disorder Brother   . Cancer Brother   . Dementia Maternal Grandmother   . ADD / ADHD Neg Hx   . Bipolar disorder Neg Hx   .  Drug abuse Neg Hx   . OCD Neg Hx   . Paranoid behavior Neg Hx   . Schizophrenia Neg Hx   . Seizures Neg Hx   . Sexual abuse Neg Hx   . Physical abuse Neg Hx   . Anxiety disorder Son   . Exostosis Son     Social History:  Social History   Socioeconomic History  . Marital status: Married    Spouse name: Not on file  . Number of children: Not on file  . Years of education: Not on file  . Highest education level: Not on file  Occupational History  . Not on file  Social Needs  . Financial resource strain: Not on file  . Food insecurity    Worry: Not on file    Inability: Not on file  . Transportation needs    Medical: Not on file    Non-medical: Not on file  Tobacco Use   . Smoking status: Never Smoker  . Smokeless tobacco: Never Used  Substance and Sexual Activity  . Alcohol use: No  . Drug use: No  . Sexual activity: Never  Lifestyle  . Physical activity    Days per week: Not on file    Minutes per session: Not on file  . Stress: Not on file  Relationships  . Social Musician on phone: Not on file    Gets together: Not on file    Attends religious service: Not on file    Active member of club or organization: Not on file    Attends meetings of clubs or organizations: Not on file    Relationship status: Not on file  Other Topics Concern  . Not on file  Social History Narrative  . Not on file    Allergies:  Allergies  Allergen Reactions  . Penicillins Other (See Comments)    Joint swelling     Metabolic Disorder Labs: No results found for: HGBA1C, MPG No results found for: PROLACTIN No results found for: CHOL, TRIG, HDL, CHOLHDL, VLDL, LDLCALC Lab Results  Component Value Date   TSH 0.487 01/15/2013    Therapeutic Level Labs: No results found for: LITHIUM No results found for: VALPROATE No components found for:  CBMZ  Current Medications: Current Outpatient Medications  Medication Sig Dispense Refill  . diltiazem (CARDIZEM CD) 180 MG 24 hr capsule     . escitalopram (LEXAPRO) 20 MG tablet Take 2 tablets (40 mg total) by mouth daily. 60 tablet 5  . hydrOXYzine (VISTARIL) 25 MG capsule TAKE 1 CAPSULE AT BEDTIME AS NEEDED. 30 capsule 5  . PROAIR HFA 108 (90 Base) MCG/ACT inhaler      No current facility-administered medications for this visit.      Musculoskeletal: Strength & Muscle Tone: within normal limits Gait & Station: normal Patient leans: N/A  Psychiatric Specialty Exam: Review of Systems  Psychiatric/Behavioral: The patient is nervous/anxious.   All other systems reviewed and are negative.   There were no vitals taken for this visit.There is no height or weight on file to calculate BMI.  General  Appearance: NA  Eye Contact:  NA  Speech:  Clear and Coherent  Volume:  Normal  Mood:  Anxious  Affect:  NA  Thought Process:  Goal Directed  Orientation:  Full (Time, Place, and Person)  Thought Content: Rumination   Suicidal Thoughts:  No  Homicidal Thoughts:  No  Memory:  Immediate;   Good Recent;   Good Remote;  Good  Judgement:  Good  Insight:  Fair  Psychomotor Activity:  Normal  Concentration:  Concentration: Good and Attention Span: Good  Recall:  Good  Fund of Knowledge: Good  Language: Good  Akathisia:  No  Handed:  Right  AIMS (if indicated): not done  Assets:  Communication Skills Desire for Improvement Physical Health Resilience Social Support Talents/Skills  ADL's:  Intact  Cognition: WNL  Sleep:  Good   Screenings:   Assessment and Plan: This patient is a 53 year old female with a history of depression and anxiety.  For the most part she is doing well although her life has been a bit more stressful recently.  She will continue Lexapro 20 mg twice daily for depression and anxiety and Vistaril 25 mg at bedtime as needed for sleep.  She will return to see me in 6 months.   [name] Dayle Sherpa, MD 07/09/2019, 1:58 PM

## 2019-12-19 ENCOUNTER — Encounter: Payer: Self-pay | Admitting: *Deleted

## 2020-01-07 ENCOUNTER — Encounter (HOSPITAL_COMMUNITY): Payer: Self-pay | Admitting: Psychiatry

## 2020-01-07 ENCOUNTER — Other Ambulatory Visit: Payer: Self-pay

## 2020-01-07 ENCOUNTER — Telehealth (INDEPENDENT_AMBULATORY_CARE_PROVIDER_SITE_OTHER): Payer: 59 | Admitting: Psychiatry

## 2020-01-07 DIAGNOSIS — F329 Major depressive disorder, single episode, unspecified: Secondary | ICD-10-CM | POA: Diagnosis not present

## 2020-01-07 DIAGNOSIS — F419 Anxiety disorder, unspecified: Secondary | ICD-10-CM

## 2020-01-07 MED ORDER — ESCITALOPRAM OXALATE 20 MG PO TABS: 40.0000 mg | ORAL_TABLET | Freq: Every day | ORAL | 5 refills | Status: DC

## 2020-01-07 NOTE — Progress Notes (Signed)
Virtual Visit via Telephone Note  I connected with Whitney Valentine on 01/07/20 at  3:40 PM EDT by telephone and verified that I am speaking with the correct person using two identifiers.   I discussed the limitations, risks, security and privacy concerns of performing an evaluation and management service by telephone and the availability of in person appointments. I also discussed with the patient that there may be a patient responsible charge related to this service. The patient expressed understanding and agreed to proceed.    I discussed the assessment and treatment plan with the patient. The patient was provided an opportunity to ask questions and all were answered. The patient agreed with the plan and demonstrated an understanding of the instructions.   The patient was advised to call back or seek an in-person evaluation if the symptoms worsen or if the condition fails to improve as anticipated.  I provided 15 minutes of non-face-to-face time during this encounter.   Whitney Ruder, MD  [facility] MD/PA/NP OP Progress Note  01/07/2020 3:59 PM Whitney Valentine  MRN:  [id number]  Chief Complaint:  Chief Complaint    Depression; Anxiety; Follow-up     HPI: This patient is a 54 year old married white female lives with her husband and 65 year old son in [place].  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  The patient returns after 6 months.  She states that she has been having a hard time managing her husband's health care.  He is a kidney transplant recipient but he had hip replacement surgery in January.  Unfortunately he got an infection which spread to the kidney and he now has to undergo dialysis 3 times weekly as well as physical therapy.  He is lost a lot of strength and weight and everything has to be done for him.  She and her son are exhausted because they are both also either working or going to college.  We discussed the fact that she needs  extra help and she is going to speak to her husband's physicians about ordering home health assistance.  She feels mostly anxious rather than depressed.  I suggested adding something else for anxiety but she declined and is going to work on trying to get more help into the home.  She is sleeping well because she is so tired at night. Visit Diagnosis:    ICD-10-CM   1. Anxiety and depression  F41.9    F32.9     Past Psychiatric History: none  Past Medical History:  Past Medical History:  Diagnosis Date  . Anxiety   . Depression   . Iron deficiency anemia     Past Surgical History:  Procedure Laterality Date  . CESAREAN SECTION      Family Psychiatric History: see below  Family History:  Family History  Problem Relation Age of Onset  . Anxiety disorder Mother   . Cancer Mother   . Depression Mother   . Alcohol abuse Father   . Anxiety disorder Father   . Cancer Father   . Depression Father   . Alcohol abuse Brother   . Anxiety disorder Brother   . Cancer Brother   . Dementia Maternal Grandmother   . ADD / ADHD Neg Hx   . Bipolar disorder Neg Hx   . Drug abuse Neg Hx   . OCD Neg Hx   . Paranoid behavior Neg Hx   . Schizophrenia Neg Hx   . Seizures Neg Hx   .  Sexual abuse Neg Hx   . Physical abuse Neg Hx   . Anxiety disorder Son   . Exostosis Son     Social History:  Social History   Socioeconomic History  . Marital status: Married    Spouse name: Not on file  . Number of children: Not on file  . Years of education: Not on file  . Highest education level: Not on file  Occupational History  . Not on file  Tobacco Use  . Smoking status: Never Smoker  . Smokeless tobacco: Never Used  Substance and Sexual Activity  . Alcohol use: No  . Drug use: No  . Sexual activity: Never  Other Topics Concern  . Not on file  Social History Narrative  . Not on file   Social Determinants of Health   Financial Resource Strain:   . Difficulty of Paying Living  Expenses:   Food Insecurity:   . Worried About Charity fundraiser in the Last Year:   . Arboriculturist in the Last Year:   Transportation Needs:   . Film/video editor (Medical):   Marland Kitchen Lack of Transportation (Non-Medical):   Physical Activity:   . Days of Exercise per Week:   . Minutes of Exercise per Session:   Stress:   . Feeling of Stress :   Social Connections:   . Frequency of Communication with Friends and Family:   . Frequency of Social Gatherings with Friends and Family:   . Attends Religious Services:   . Active Member of Clubs or Organizations:   . Attends Archivist Meetings:   Marland Kitchen Marital Status:     Allergies:  Allergies  Allergen Reactions  . Penicillins Other (See Comments)    Joint swelling     Metabolic Disorder Labs: No results found for: HGBA1C, MPG No results found for: PROLACTIN No results found for: CHOL, TRIG, HDL, CHOLHDL, VLDL, LDLCALC Lab Results  Component Value Date   TSH 0.487 01/15/2013    Therapeutic Level Labs: No results found for: LITHIUM No results found for: VALPROATE No components found for:  CBMZ  Current Medications: Current Outpatient Medications  Medication Sig Dispense Refill  . diltiazem (CARDIZEM CD) 180 MG 24 hr capsule     . escitalopram (LEXAPRO) 20 MG tablet Take 2 tablets (40 mg total) by mouth daily. 60 tablet 5  . PROAIR HFA 108 (90 Base) MCG/ACT inhaler      No current facility-administered medications for this visit.     Musculoskeletal: Strength & Muscle Tone: within normal limits Gait & Station: normal Patient leans: N/A  Psychiatric Specialty Exam: Review of Systems  Psychiatric/Behavioral: The patient is nervous/anxious.   All other systems reviewed and are negative.   There were no vitals taken for this visit.There is no height or weight on file to calculate BMI.  General Appearance: NA  Eye Contact:  NA  Speech:  Clear and Coherent  Volume:  Normal  Mood:  Anxious  Affect:  NA   Thought Process:  Goal Directed  Orientation:  Full (Time, Place, and Person)  Thought Content: Rumination   Suicidal Thoughts:  No  Homicidal Thoughts:  No  Memory:  Immediate;   Good Recent;   Good Remote;   Fair  Judgement:  Good  Insight:  Good  Psychomotor Activity:  EPS  Concentration:  Concentration: Good and Attention Span: Good  Recall:  Good  Fund of Knowledge: Good  Language: Good  Akathisia:  No  Handed:  Right  AIMS (if indicated): not done  Assets:  Communication Skills Desire for Improvement Physical Health Resilience Social Support Talents/Skills  ADL's:  Intact  Cognition: WNL  Sleep:  Good   Screenings:   Assessment and Plan: This patient is a 54 year old female with a history of depression and anxiety.  Her husband's health issues are taking over her life and this has been very difficult.  I urged her to get more help into the home.  She is going to try.  For now she does not want to change her medication.  She will continue Lexapro 20 mg twice daily for depression and anxiety.  She will return to see me in 3 months   Whitney Ruder, MD 01/07/2020, 3:59 PM

## 2020-03-17 ENCOUNTER — Other Ambulatory Visit: Payer: Self-pay

## 2020-03-17 ENCOUNTER — Encounter: Payer: Self-pay | Admitting: Internal Medicine

## 2020-03-17 ENCOUNTER — Ambulatory Visit: Payer: Managed Care, Other (non HMO)

## 2020-03-30 ENCOUNTER — Telehealth (INDEPENDENT_AMBULATORY_CARE_PROVIDER_SITE_OTHER): Payer: 59 | Admitting: Psychiatry

## 2020-03-30 ENCOUNTER — Encounter (HOSPITAL_COMMUNITY): Payer: Self-pay | Admitting: Psychiatry

## 2020-03-30 ENCOUNTER — Other Ambulatory Visit: Payer: Self-pay

## 2020-03-30 DIAGNOSIS — F329 Major depressive disorder, single episode, unspecified: Secondary | ICD-10-CM

## 2020-03-30 DIAGNOSIS — F419 Anxiety disorder, unspecified: Secondary | ICD-10-CM | POA: Diagnosis not present

## 2020-03-30 MED ORDER — ESCITALOPRAM OXALATE 20 MG PO TABS: 40.0000 mg | ORAL_TABLET | Freq: Every day | ORAL | 5 refills | Status: DC

## 2020-03-30 NOTE — Progress Notes (Signed)
Virtual Visit via Telephone Note  I connected with Whitney Valentine on 03/30/20 at  3:00 PM EDT by telephone and verified that I am speaking with the correct person using two identifiers.   I discussed the limitations, risks, security and privacy concerns of performing an evaluation and management service by telephone and the availability of in person appointments. I also discussed with the patient that there may be a patient responsible charge related to this service. The patient expressed understanding and agreed to proceed.     I discussed the assessment and treatment plan with the patient. The patient was provided an opportunity to ask questions and all were answered. The patient agreed with the plan and demonstrated an understanding of the instructions.   The patient was advised to call back or seek an in-person evaluation if the symptoms worsen or if the condition fails to improve as anticipated.  I provided 15 minutes of non-face-to-face time during this encounter. Location: Provider office, patient home  [name], MD  [facility] MD/PA/NP OP Progress Note  03/30/2020 3:17 PM Whitney Valentine  MRN:  [id number]  Chief Complaint:  Chief Complaint    Depression; Anxiety; Follow-up     HPI: This patient is a 54 year old married white female who lives with her husband and 63 year old son in [place].  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  The patient returns for follow-up after 2 months.  Last time she was very stressed because her husband, who is a kidney transplant recipient developed an infection after hip surgery.  He began having to undergo dialysis and physical therapy.  This was taking up much of her free time and she was very tired.  However now her husband is off dialysis and he is doing much better.  He is walking on his own with the use of a cane or walker.  Her son is also helping out.  I had offered to give her medication for anxiety  last time but she declined.  She now states that she is doing well with just the Lexapro.  She is working full-time and is sleeping well and denies significant depression or anxiety. Visit Diagnosis:    ICD-10-CM   1. Anxiety and depression  F41.9    F32.9     Past Psychiatric History: none  Past Medical History:  Past Medical History:  Diagnosis Date  . Anxiety   . Depression   . Iron deficiency anemia     Past Surgical History:  Procedure Laterality Date  . CESAREAN SECTION      Family Psychiatric History: see below  Family History:  Family History  Problem Relation Age of Onset  . Anxiety disorder Mother   . Cancer Mother   . Depression Mother   . Alcohol abuse Father   . Anxiety disorder Father   . Cancer Father   . Depression Father   . Alcohol abuse Brother   . Anxiety disorder Brother   . Cancer Brother   . Dementia Maternal Grandmother   . ADD / ADHD Neg Hx   . Bipolar disorder Neg Hx   . Drug abuse Neg Hx   . OCD Neg Hx   . Paranoid behavior Neg Hx   . Schizophrenia Neg Hx   . Seizures Neg Hx   . Sexual abuse Neg Hx   . Physical abuse Neg Hx   . Anxiety disorder Son   . Exostosis Son     Social History:  Social History   Socioeconomic History  . Marital status: Married    Spouse name: Not on file  . Number of children: Not on file  . Years of education: Not on file  . Highest education level: Not on file  Occupational History  . Not on file  Tobacco Use  . Smoking status: Never Smoker  . Smokeless tobacco: Never Used  Substance and Sexual Activity  . Alcohol use: No  . Drug use: No  . Sexual activity: Never  Other Topics Concern  . Not on file  Social History Narrative  . Not on file   Social Determinants of Health   Financial Resource Strain:   . Difficulty of Paying Living Expenses:   Food Insecurity:   . Worried About Programme researcher, broadcasting/film/video in the Last Year:   . Barista in the Last Year:   Transportation Needs:   . Sales promotion account executive (Medical):   Marland Kitchen Lack of Transportation (Non-Medical):   Physical Activity:   . Days of Exercise per Week:   . Minutes of Exercise per Session:   Stress:   . Feeling of Stress :   Social Connections:   . Frequency of Communication with Friends and Family:   . Frequency of Social Gatherings with Friends and Family:   . Attends Religious Services:   . Active Member of Clubs or Organizations:   . Attends Banker Meetings:   Marland Kitchen Marital Status:     Allergies:  Allergies  Allergen Reactions  . Penicillins Other (See Comments)    Joint swelling     Metabolic Disorder Labs: No results found for: HGBA1C, MPG No results found for: PROLACTIN No results found for: CHOL, TRIG, HDL, CHOLHDL, VLDL, LDLCALC Lab Results  Component Value Date   TSH 0.487 01/15/2013    Therapeutic Level Labs: No results found for: LITHIUM No results found for: VALPROATE No components found for:  CBMZ  Current Medications: Current Outpatient Medications  Medication Sig Dispense Refill  . diltiazem (CARDIZEM CD) 180 MG 24 hr capsule     . escitalopram (LEXAPRO) 20 MG tablet Take 2 tablets (40 mg total) by mouth daily. 60 tablet 5  . PROAIR HFA 108 (90 Base) MCG/ACT inhaler      No current facility-administered medications for this visit.     Musculoskeletal: Strength & Muscle Tone: within normal limits Gait & Station: normal Patient leans: N/A  Psychiatric Specialty Exam: Review of Systems  All other systems reviewed and are negative.   There were no vitals taken for this visit.There is no height or weight on file to calculate BMI.  General Appearance: NA  Eye Contact:  NA  Speech:  Clear and Coherent  Volume:  Normal  Mood:  Euthymic  Affect:  NA  Thought Process:  Goal Directed  Orientation:  Full (Time, Place, and Person)  Thought Content: WDL   Suicidal Thoughts:  No  Homicidal Thoughts:  No  Memory:  Immediate;   Good Recent;   Good Remote;   Fair   Judgement:  Good  Insight:  Good  Psychomotor Activity:  Normal  Concentration:  Concentration: Good and Attention Span: Good  Recall:  Good  Fund of Knowledge: Good  Language: Good  Akathisia:  No  Handed:  Right  AIMS (if indicated): not done  Assets:  Communication Skills Desire for Improvement Physical Health Resilience Social Support Talents/Skills  ADL's:  Intact  Cognition: WNL  Sleep:  Good  Screenings:   Assessment and Plan: This patient is a 54 year old female with a history of depression and anxiety.  Since her husband has been doing better her level of anxiety is diminished and she continues to do well on her current regimen.  She will continue Lexapro 20 mg twice daily for depression and anxiety.  She will return to see me in 6 months   [name], MD 03/30/2020, 3:17 PM

## 2020-04-01 ENCOUNTER — Telehealth (HOSPITAL_COMMUNITY): Payer: 59 | Admitting: Psychiatry

## 2020-04-08 ENCOUNTER — Telehealth (HOSPITAL_COMMUNITY): Payer: 59 | Admitting: Psychiatry

## 2020-07-28 ENCOUNTER — Other Ambulatory Visit (HOSPITAL_COMMUNITY): Payer: Self-pay | Admitting: Psychiatry

## 2020-09-30 ENCOUNTER — Other Ambulatory Visit: Payer: Self-pay

## 2020-09-30 ENCOUNTER — Telehealth (HOSPITAL_COMMUNITY): Payer: 59 | Admitting: Psychiatry
